# Patient Record
Sex: Female | Born: 1980
Health system: Southern US, Community
[De-identification: ages and names within clinical notes are randomized; demographics above are authoritative.]

## PROBLEM LIST (undated history)

## (undated) DIAGNOSIS — E785 Hyperlipidemia, unspecified: Secondary | ICD-10-CM

## (undated) DIAGNOSIS — F32A Depression, unspecified: Secondary | ICD-10-CM

## (undated) DIAGNOSIS — I10 Essential (primary) hypertension: Secondary | ICD-10-CM

## (undated) DIAGNOSIS — F329 Major depressive disorder, single episode, unspecified: Secondary | ICD-10-CM

## (undated) DIAGNOSIS — F419 Anxiety disorder, unspecified: Secondary | ICD-10-CM

## (undated) DIAGNOSIS — R569 Unspecified convulsions: Secondary | ICD-10-CM

## (undated) HISTORY — DX: Depression, unspecified: F32.A

## (undated) HISTORY — DX: Major depressive disorder, single episode, unspecified: F32.9

## (undated) HISTORY — DX: Anxiety disorder, unspecified: F41.9

## (undated) HISTORY — DX: Hyperlipidemia, unspecified: E78.5

## (undated) HISTORY — DX: Essential (primary) hypertension: I10

---

## 1998-12-30 ENCOUNTER — Other Ambulatory Visit: Admission: RE | Admit: 1998-12-30 | Discharge: 1998-12-30 | Payer: Self-pay | Admitting: Obstetrics & Gynecology

## 2004-01-10 HISTORY — PX: BRAIN SURGERY: SHX531

## 2006-05-31 ENCOUNTER — Emergency Department (HOSPITAL_COMMUNITY): Admission: EM | Admit: 2006-05-31 | Discharge: 2006-05-31 | Payer: Self-pay | Admitting: Emergency Medicine

## 2015-11-22 LAB — OB RESULTS CONSOLE ANTIBODY SCREEN: ANTIBODY SCREEN: NEGATIVE

## 2015-11-22 LAB — OB RESULTS CONSOLE ABO/RH: RH Type: POSITIVE

## 2015-11-22 LAB — OB RESULTS CONSOLE RPR: RPR: NONREACTIVE

## 2015-11-22 LAB — OB RESULTS CONSOLE RUBELLA ANTIBODY, IGM: RUBELLA: IMMUNE

## 2015-11-22 LAB — OB RESULTS CONSOLE HIV ANTIBODY (ROUTINE TESTING): HIV: NONREACTIVE

## 2015-11-22 LAB — OB RESULTS CONSOLE HEPATITIS B SURFACE ANTIGEN: HEP B S AG: NEGATIVE

## 2015-12-17 DIAGNOSIS — Z3480 Encounter for supervision of other normal pregnancy, unspecified trimester: Secondary | ICD-10-CM | POA: Diagnosis not present

## 2015-12-20 DIAGNOSIS — F419 Anxiety disorder, unspecified: Secondary | ICD-10-CM | POA: Diagnosis not present

## 2015-12-24 DIAGNOSIS — O09521 Supervision of elderly multigravida, first trimester: Secondary | ICD-10-CM | POA: Diagnosis not present

## 2015-12-26 LAB — OB RESULTS CONSOLE GC/CHLAMYDIA
Chlamydia: NEGATIVE
Gonorrhea: NEGATIVE

## 2015-12-30 DIAGNOSIS — F330 Major depressive disorder, recurrent, mild: Secondary | ICD-10-CM | POA: Diagnosis not present

## 2015-12-31 ENCOUNTER — Other Ambulatory Visit: Payer: Self-pay | Admitting: Obstetrics and Gynecology

## 2016-01-06 ENCOUNTER — Other Ambulatory Visit (HOSPITAL_COMMUNITY): Payer: Self-pay | Admitting: Obstetrics and Gynecology

## 2016-01-06 ENCOUNTER — Encounter (HOSPITAL_COMMUNITY): Payer: Self-pay

## 2016-01-06 DIAGNOSIS — O09522 Supervision of elderly multigravida, second trimester: Secondary | ICD-10-CM

## 2016-01-06 DIAGNOSIS — Z3A19 19 weeks gestation of pregnancy: Secondary | ICD-10-CM

## 2016-01-06 DIAGNOSIS — Z3689 Encounter for other specified antenatal screening: Secondary | ICD-10-CM

## 2016-01-06 DIAGNOSIS — G40909 Epilepsy, unspecified, not intractable, without status epilepticus: Secondary | ICD-10-CM

## 2016-01-07 DIAGNOSIS — Z23 Encounter for immunization: Secondary | ICD-10-CM | POA: Diagnosis not present

## 2016-01-10 NOTE — L&D Delivery Note (Signed)
Delivery Note  First Stage: Labor onset: prodromal x few days Augmentation : AROM @ 1548 with progression into active labor Analgesia /Anesthesia intrapartum: water immersion and nitrous gas  Second Stage: Complete dilation at 1904 Onset of pushing at 1915 FHR second stage intermittent 130s  Delivery of a viable female at [id number] by CNM in ROA position no nuchal cord Cord double clamped after cessation of pulsation, cut by FOB Cord blood sample collected   Third Stage: [facility] intact with 3 VC @ 2004 Placenta disposition: home with family Uterine tone firm / bleeding small  Small partial 1st perineal splayed at introitus edge Anesthesia for repair: 1% lidocaine local Repair 4-0 x 1 suture for hemostasis Est. Blood Loss (mL): 150  Complications: none  Mom to postpartum.  Baby to Couplet care / Skin to Skin.  Newborn: Birth Weight: 10.3 pounds Apgar Scores: 9-9 Feeding planned: breast  Marlinda MikeBAILEY, Viney Acocella CNM, MSN, FACNM 07/24/2016, 8:14 PM

## 2016-01-11 ENCOUNTER — Ambulatory Visit (HOSPITAL_COMMUNITY)
Admission: RE | Admit: 2016-01-11 | Discharge: 2016-01-11 | Disposition: A | Discharge status: Home / Self Care | Payer: BLUE CROSS/BLUE SHIELD | Source: Ambulatory Visit | Attending: Obstetrics and Gynecology | Admitting: Obstetrics and Gynecology

## 2016-01-11 ENCOUNTER — Other Ambulatory Visit (HOSPITAL_COMMUNITY): Payer: Self-pay | Admitting: *Deleted

## 2016-01-11 VITALS — BP 119/75 | HR 73 | Wt 168.5 lb

## 2016-01-11 DIAGNOSIS — Z315 Encounter for genetic counseling: Secondary | ICD-10-CM | POA: Insufficient documentation

## 2016-01-11 DIAGNOSIS — Z3A13 13 weeks gestation of pregnancy: Secondary | ICD-10-CM | POA: Diagnosis not present

## 2016-01-11 DIAGNOSIS — G40909 Epilepsy, unspecified, not intractable, without status epilepticus: Secondary | ICD-10-CM

## 2016-01-11 DIAGNOSIS — O99351 Diseases of the nervous system complicating pregnancy, first trimester: Secondary | ICD-10-CM | POA: Diagnosis not present

## 2016-01-11 DIAGNOSIS — O09529 Supervision of elderly multigravida, unspecified trimester: Secondary | ICD-10-CM | POA: Insufficient documentation

## 2016-01-11 DIAGNOSIS — O99352 Diseases of the nervous system complicating pregnancy, second trimester: Principal | ICD-10-CM

## 2016-01-11 DIAGNOSIS — O09521 Supervision of elderly multigravida, first trimester: Secondary | ICD-10-CM | POA: Insufficient documentation

## 2016-01-11 NOTE — Progress Notes (Signed)
Genetic Counseling  High-Risk Gestation Note  Appointment Date:  01/11/2016 Referred By: Gerald Leitz, MD Date of Birth:  [birth year]   Pregnancy History: [id number] Estimated Date of Delivery: 07/17/16 Estimated Gestational Age: [redacted]w[redacted]d Attending: Charlsie Merles, MD    Ms. Stacy Rivera was seen for genetic counseling because of a maternal age of 36 y.o..     In summary:  Discussed increased risk for fetal aneuploidy with advanced maternal age  Reviewed results of patient's NIPS ([place])   Ultrasound scheduled for 02/21/16  Discussed limitations of screening and option of diagnostic testing  Declined amniocentesis  Reviewed family history concerns  Discussed carrier screening options  CF- declined  SMA- elected to pursue today  Hemoglobinopathies-declined  MFM consult today given history of epilepsy and Keppra use currently  She was counseled regarding maternal age and the association with risk for chromosome conditions due to nondisjunction with aging of the ova.   We reviewed chromosomes, nondisjunction, and the associated 1 in 65 risk for fetal aneuploidy related to a maternal age of 36 y.o. at [redacted]w[redacted]d gestation.  She was counseled that the risk for aneuploidy decreases as gestational age increases, accounting for those pregnancies which spontaneously abort.  We specifically discussed Down syndrome (trisomy 30), trisomies 37 and 67, and sex chromosome aneuploidies (47,XXX and 47,XXY) including the common features and prognoses of each.   We also reviewed the results of Ms. Leory Plowman Hemann's non-invasive prenatal screening (NIPS).  We discussed that NIPS analyzes placental cell free DNA in maternal circulation to evaluate for the presence of extra chromosome conditions.  Thus, it is able to provide risk assessment for specific chromosome conditions, but is not diagnostic.  Specifically, Ms. Mullarkey had [place] through [place]. We reviewed that these are within normal limits, showing a less  than 1 in 10,000 risk for trisomies 21, 18 and 13, and monosomy X (Turner syndrome).  In addition, the risk for triploidy and sex chromosome trisomies (47,XXX and 47,XXY) was also low risk.   We reviewed that this testing identifies > 99% of pregnancies with trisomy 77, trisomy 31, sex chromosome trisomies (47,XXX and 47,XXY), and triploidy. The detection rate for trisomy 18 is 98%.  The detection rate for monosomy X is ~92%.  The false positive rate is <0.1% for all conditions. Testing was also consistent with female fetal sex.    She was also counseled that 50-80% of fetuses with Down syndrome and up to 90% of fetuses with trisomies 13 and 18, when well visualized, have detectable anomalies or soft markers by ultrasound.  Detailed ultrasound is scheduled for 02/21/16 in our office.   We also discussed the availability of diagnostic testing by way of amniocentesis.  We reviewed the risks, benefits and limitations of amniocentesis including the approximate 1 in 300-500 risk for pregnancy complications following amniocentesis. We discussed the possible results that the tests might provide including: positive, negative, unanticipated, and no result. Finally, they were counseled regarding the cost of each option and potential out of pocket expenses. After reviewing the above results and the available options, Ms. Jerral Ralph declined amniocentesis.  She understands that ultrasound and NIPS cannot rule out all birth defects or genetic syndromes.    Ms. BRIGGETTE NAJARIAN  was provided with written information regarding cystic fibrosis (CF), spinal muscular atrophy (SMA) and hemoglobinopathies including the carrier frequency, availability of carrier screening and prenatal diagnosis if indicated.  In addition, we discussed that CF and hemoglobinopathies are routinely screened for as part of  the [place] newborn screening panel.  After further discussion, she elected to pursue SMA carrier screening and declined CF and  hemoglobinopathy evaluation at this time.  Both family histories were reviewed and found to be noncontributory for birth defects, intellectual disability, and known genetic conditions. Without further information regarding the provided family history, an accurate genetic risk cannot be calculated. Further genetic counseling is warranted if more information is obtained. Ms. Imogene BurnChen reported [place] ancestry, and her husband reportedly has [place] ancestry.   Ms. Jerral Ralphngrid Y Curto denied exposure to environmental toxins or chemical agents. She denied the use of alcohol, tobacco or street drugs. She denied significant viral illnesses during the course of her pregnancy. Her medical and surgical histories were contributory for epilepsy secondary to a cavernous malformation, status post surgery at age 36 years old. She is currently taking Keppra. See separate MFM consultation note from today's visit for detailed discussion regarding this history and management in the current pregnancy.    I counseled Ms. Jerral RalphIngrid Y Miskell regarding the above risks and available options.  The approximate face-to-face time with the genetic counselor was 40 minutes.  Quinn PlowmanKaren Allin Frix, MS,  Certified Genetic Counselor 01/11/2016

## 2016-01-11 NOTE — Progress Notes (Signed)
Maternal Fetal Medicine Consultation  Requesting Provider(s): [name]  Primary Ob: [name] Reason for consultation: Epilepsy. AMA  HPI: 35yo P1011 at 13+1 weeks referred for her hisory of epilepsy. She has advanced maternal age, and saw genetic counseling already today. He has negative NIPT, but did elect to undergo SMA testing, which has been done this morning. She was diagnosed with epilepsy after a grand mal seizure at age 36. She had partial complex seizures controlled with meds. 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 She has been on "maintainence" Keppra since that time. She has had 2 seizures since her surgery, one in 2008, and the last in 2011. Her neurologist felt these were "outliers" and were not indicative of treatment failure. [place] and has not established with a neurologist as of yet. She wants to get re-established with [facility] neurolog[facility] and is calling them soon. She has not had a recent levetiracetam level.   OB History: OB History    Gravida Para Term Preterm AB Living   3 1 1   1 1    SAB TAB Ectopic Multiple Live Births   1            The patient had a poor delivery experience and suffered from some postpartum depression  PMH: No past medical history on file.  PSH: No past surgical history on file. Meds: PNV, levetiracetam 1125mg  daily Allergies: None Fh: Noncontributory Soc: Denies alcohol, tobacco and illicit drug use Review of Systems: no vaginal bleeding or cramping/contractions, no LOF, no nausea/vomiting. All other systems reviewed and are negative.   PE: See EPIC section    A/P:  1. AMA: see genetic counseling report  2. Epilepsy and medication exposure: I reassured the patient that she was on the "drug of choice" for epilepsy in pregnancy and that her risk for anomalies was only minimally elevated above the baseline. I have scheduled her here for detailed  anatomic survey at 19 weeks, and for fetal echocardiography at 22-24 weeks. She might benefit from growth scans periodically during the remainder of her pregnancy, and these can be done here or in your office at your discretion. She will be coming in to your office next week and I would strongly recommend getting a levetiracetam level at that visit. It is unlikely she will be able to get a neurology appointment before then. Levetiracetam undergoes a much higher level of clearance in pregnancy than many other antiepileptics, with levels reaching 40% of baseline by the 3rd trimester. She would benefit from levels every 4-6 weeks.  3. History of postpartum depression: recommend evaluation with a screening tool such as Edinburgh scale during mid-to-late 3rd trimester  Thank you for the opportunity to be a part of the care of Stacy Rivera. Please contact our office if we can be of further assistance.   I spent approximately 30 minutes with this patient with over 50% of time spent in face-to-face counseling.

## 2016-01-13 DIAGNOSIS — Z8669 Personal history of other diseases of the nervous system and sense organs: Secondary | ICD-10-CM | POA: Diagnosis not present

## 2016-01-13 DIAGNOSIS — Z3A13 13 weeks gestation of pregnancy: Secondary | ICD-10-CM | POA: Diagnosis not present

## 2016-01-13 DIAGNOSIS — F330 Major depressive disorder, recurrent, mild: Secondary | ICD-10-CM | POA: Diagnosis not present

## 2016-01-17 ENCOUNTER — Other Ambulatory Visit: Payer: Self-pay

## 2016-01-17 DIAGNOSIS — Z1371 Encounter for nonprocreative screening for genetic disease carrier status: Secondary | ICD-10-CM | POA: Diagnosis not present

## 2016-01-17 DIAGNOSIS — Z31430 Encounter of female for testing for genetic disease carrier status for procreative management: Secondary | ICD-10-CM | POA: Diagnosis not present

## 2016-01-19 ENCOUNTER — Telehealth (HOSPITAL_COMMUNITY): Payer: Self-pay | Admitting: Genetics

## 2016-01-19 NOTE — Telephone Encounter (Signed)
Called Stacy Rivera regarding her Counsyl carrier screening results.  Stacy Rivera underwent screening for Spinal Muscular Atrophy (SMA) only.  This screen identified that she has two copies of the SMN1 gene, which is a normal result.  We discussed that we cannot prove that those copies are on different chromosomes, which is why there is a residual 1 in 700 chance for her to be a carrier for SMA.  She understood the information discussed and had no additional questions.  She has the contact information for our office and will call if additional questions or concerns arise. Mady Gemmaaragh Auren Valdes, MS Certified Genetic Counselor

## 2016-01-20 DIAGNOSIS — Z3480 Encounter for supervision of other normal pregnancy, unspecified trimester: Secondary | ICD-10-CM | POA: Diagnosis not present

## 2016-01-26 DIAGNOSIS — J019 Acute sinusitis, unspecified: Secondary | ICD-10-CM | POA: Diagnosis not present

## 2016-02-03 DIAGNOSIS — F330 Major depressive disorder, recurrent, mild: Secondary | ICD-10-CM | POA: Diagnosis not present

## 2016-02-17 DIAGNOSIS — F330 Major depressive disorder, recurrent, mild: Secondary | ICD-10-CM | POA: Diagnosis not present

## 2016-02-21 ENCOUNTER — Ambulatory Visit (HOSPITAL_COMMUNITY): Payer: Self-pay

## 2016-02-21 ENCOUNTER — Encounter (HOSPITAL_COMMUNITY): Payer: Self-pay

## 2016-02-22 ENCOUNTER — Ambulatory Visit (HOSPITAL_COMMUNITY): Payer: Self-pay

## 2016-02-22 ENCOUNTER — Encounter (HOSPITAL_COMMUNITY): Payer: Self-pay

## 2016-02-22 ENCOUNTER — Ambulatory Visit (HOSPITAL_COMMUNITY)
Admission: RE | Admit: 2016-02-22 | Discharge: 2016-02-22 | Disposition: A | Discharge status: Home / Self Care | Payer: BLUE CROSS/BLUE SHIELD | Source: Ambulatory Visit | Attending: Obstetrics and Gynecology | Admitting: Obstetrics and Gynecology

## 2016-02-22 DIAGNOSIS — G40909 Epilepsy, unspecified, not intractable, without status epilepticus: Secondary | ICD-10-CM | POA: Diagnosis not present

## 2016-02-22 DIAGNOSIS — O99352 Diseases of the nervous system complicating pregnancy, second trimester: Secondary | ICD-10-CM | POA: Insufficient documentation

## 2016-02-22 DIAGNOSIS — Z3689 Encounter for other specified antenatal screening: Secondary | ICD-10-CM

## 2016-02-22 DIAGNOSIS — Z3A19 19 weeks gestation of pregnancy: Secondary | ICD-10-CM | POA: Insufficient documentation

## 2016-02-22 DIAGNOSIS — O09522 Supervision of elderly multigravida, second trimester: Secondary | ICD-10-CM | POA: Insufficient documentation

## 2016-02-22 DIAGNOSIS — Z363 Encounter for antenatal screening for malformations: Secondary | ICD-10-CM | POA: Insufficient documentation

## 2016-02-22 DIAGNOSIS — O99322 Drug use complicating pregnancy, second trimester: Secondary | ICD-10-CM | POA: Diagnosis not present

## 2016-02-22 DIAGNOSIS — O09529 Supervision of elderly multigravida, unspecified trimester: Secondary | ICD-10-CM

## 2016-02-27 DIAGNOSIS — O09523 Supervision of elderly multigravida, third trimester: Secondary | ICD-10-CM | POA: Diagnosis not present

## 2016-02-27 DIAGNOSIS — Z3480 Encounter for supervision of other normal pregnancy, unspecified trimester: Secondary | ICD-10-CM | POA: Diagnosis not present

## 2016-02-27 DIAGNOSIS — Z3A33 33 weeks gestation of pregnancy: Secondary | ICD-10-CM | POA: Diagnosis not present

## 2016-02-27 DIAGNOSIS — O99353 Diseases of the nervous system complicating pregnancy, third trimester: Secondary | ICD-10-CM | POA: Diagnosis not present

## 2016-03-02 DIAGNOSIS — F330 Major depressive disorder, recurrent, mild: Secondary | ICD-10-CM | POA: Diagnosis not present

## 2016-03-16 DIAGNOSIS — F330 Major depressive disorder, recurrent, mild: Secondary | ICD-10-CM | POA: Diagnosis not present

## 2016-03-17 DIAGNOSIS — Z3A22 22 weeks gestation of pregnancy: Secondary | ICD-10-CM | POA: Diagnosis not present

## 2016-03-17 DIAGNOSIS — G40909 Epilepsy, unspecified, not intractable, without status epilepticus: Secondary | ICD-10-CM | POA: Diagnosis not present

## 2016-03-17 DIAGNOSIS — O99350 Diseases of the nervous system complicating pregnancy, unspecified trimester: Secondary | ICD-10-CM | POA: Diagnosis not present

## 2016-04-12 DIAGNOSIS — Z3A26 26 weeks gestation of pregnancy: Secondary | ICD-10-CM | POA: Diagnosis not present

## 2016-04-12 DIAGNOSIS — O09522 Supervision of elderly multigravida, second trimester: Secondary | ICD-10-CM | POA: Diagnosis not present

## 2016-04-13 DIAGNOSIS — F330 Major depressive disorder, recurrent, mild: Secondary | ICD-10-CM | POA: Diagnosis not present

## 2016-04-27 DIAGNOSIS — F330 Major depressive disorder, recurrent, mild: Secondary | ICD-10-CM | POA: Diagnosis not present

## 2016-05-04 DIAGNOSIS — O09523 Supervision of elderly multigravida, third trimester: Secondary | ICD-10-CM | POA: Diagnosis not present

## 2016-05-04 DIAGNOSIS — Z3689 Encounter for other specified antenatal screening: Secondary | ICD-10-CM | POA: Diagnosis not present

## 2016-05-04 DIAGNOSIS — Z23 Encounter for immunization: Secondary | ICD-10-CM | POA: Diagnosis not present

## 2016-05-04 DIAGNOSIS — Z3A29 29 weeks gestation of pregnancy: Secondary | ICD-10-CM | POA: Diagnosis not present

## 2016-05-16 DIAGNOSIS — Z3A13 13 weeks gestation of pregnancy: Secondary | ICD-10-CM | POA: Diagnosis not present

## 2016-05-16 DIAGNOSIS — G2581 Restless legs syndrome: Secondary | ICD-10-CM | POA: Diagnosis not present

## 2016-05-16 DIAGNOSIS — R569 Unspecified convulsions: Secondary | ICD-10-CM | POA: Diagnosis not present

## 2016-05-16 DIAGNOSIS — O26891 Other specified pregnancy related conditions, first trimester: Secondary | ICD-10-CM | POA: Diagnosis not present

## 2016-05-16 DIAGNOSIS — G40109 Localization-related (focal) (partial) symptomatic epilepsy and epileptic syndromes with simple partial seizures, not intractable, without status epilepticus: Secondary | ICD-10-CM | POA: Diagnosis not present

## 2016-05-16 DIAGNOSIS — Z9889 Other specified postprocedural states: Secondary | ICD-10-CM | POA: Diagnosis not present

## 2016-05-16 DIAGNOSIS — Z8669 Personal history of other diseases of the nervous system and sense organs: Secondary | ICD-10-CM | POA: Diagnosis not present

## 2016-05-18 DIAGNOSIS — Z3A31 31 weeks gestation of pregnancy: Secondary | ICD-10-CM | POA: Diagnosis not present

## 2016-05-18 DIAGNOSIS — O99810 Abnormal glucose complicating pregnancy: Secondary | ICD-10-CM | POA: Diagnosis not present

## 2016-05-25 DIAGNOSIS — F330 Major depressive disorder, recurrent, mild: Secondary | ICD-10-CM | POA: Diagnosis not present

## 2016-05-29 DIAGNOSIS — Z3A33 33 weeks gestation of pregnancy: Secondary | ICD-10-CM | POA: Diagnosis not present

## 2016-05-29 DIAGNOSIS — O09523 Supervision of elderly multigravida, third trimester: Secondary | ICD-10-CM | POA: Diagnosis not present

## 2016-05-29 DIAGNOSIS — O99353 Diseases of the nervous system complicating pregnancy, third trimester: Secondary | ICD-10-CM | POA: Diagnosis not present

## 2016-06-14 DIAGNOSIS — Z3685 Encounter for antenatal screening for Streptococcus B: Secondary | ICD-10-CM | POA: Diagnosis not present

## 2016-06-14 DIAGNOSIS — Z3483 Encounter for supervision of other normal pregnancy, third trimester: Secondary | ICD-10-CM | POA: Diagnosis not present

## 2016-06-14 LAB — OB RESULTS CONSOLE GBS: STREP GROUP B AG: NEGATIVE

## 2016-06-15 DIAGNOSIS — F330 Major depressive disorder, recurrent, mild: Secondary | ICD-10-CM | POA: Diagnosis not present

## 2016-06-26 DIAGNOSIS — Z3A37 37 weeks gestation of pregnancy: Secondary | ICD-10-CM | POA: Diagnosis not present

## 2016-06-26 DIAGNOSIS — O99353 Diseases of the nervous system complicating pregnancy, third trimester: Secondary | ICD-10-CM | POA: Diagnosis not present

## 2016-06-26 DIAGNOSIS — O09523 Supervision of elderly multigravida, third trimester: Secondary | ICD-10-CM | POA: Diagnosis not present

## 2016-06-29 DIAGNOSIS — F330 Major depressive disorder, recurrent, mild: Secondary | ICD-10-CM | POA: Diagnosis not present

## 2016-07-03 DIAGNOSIS — Z3A38 38 weeks gestation of pregnancy: Secondary | ICD-10-CM | POA: Diagnosis not present

## 2016-07-03 DIAGNOSIS — O09523 Supervision of elderly multigravida, third trimester: Secondary | ICD-10-CM | POA: Diagnosis not present

## 2016-07-06 DIAGNOSIS — F330 Major depressive disorder, recurrent, mild: Secondary | ICD-10-CM | POA: Diagnosis not present

## 2016-07-12 ENCOUNTER — Encounter (HOSPITAL_COMMUNITY): Payer: Self-pay | Admitting: *Deleted

## 2016-07-12 ENCOUNTER — Inpatient Hospital Stay (HOSPITAL_COMMUNITY)
Admission: AD | Admit: 2016-07-12 | Discharge: 2016-07-12 | Disposition: A | Discharge status: Home / Self Care | Payer: BLUE CROSS/BLUE SHIELD | Source: Ambulatory Visit | Attending: Obstetrics & Gynecology | Admitting: Obstetrics & Gynecology

## 2016-07-12 DIAGNOSIS — O471 False labor at or after 37 completed weeks of gestation: Secondary | ICD-10-CM | POA: Insufficient documentation

## 2016-07-12 DIAGNOSIS — O09529 Supervision of elderly multigravida, unspecified trimester: Secondary | ICD-10-CM

## 2016-07-12 DIAGNOSIS — Z3A39 39 weeks gestation of pregnancy: Secondary | ICD-10-CM | POA: Diagnosis not present

## 2016-07-12 HISTORY — DX: Unspecified convulsions: R56.9

## 2016-07-12 NOTE — MAU Note (Signed)
Pt reports contractions since Monday which are getting stronger this morning.

## 2016-07-12 NOTE — Discharge Instructions (Signed)

## 2016-07-12 NOTE — MAU Note (Signed)
I have communicated with T Baily CNM and reviewed vital signs:  Vitals:   07/12/16 0336 07/12/16 0421  BP: 114/81 116/84  Pulse: 79 84  Resp: 18 18  Temp: 98 F (36.7 C)     Vaginal exam:  Dilation: 4 Effacement (%): 60 Cervical Position: Middle Station: -2 Presentation: Vertex Exam by:: B Korry Dalgleish RN,   T Baily CNM also reviewed contraction pattern and that non-stress test is reactive.  It has been documented that patient is contracting every 4-9 minutes with minimal cervical change since her exam in the office not indicating active labor.  Patient denies any other complaints.  Based on this report provider has given order for discharge.  A discharge order and diagnosis entered by a provider.   Labor discharge instructions reviewed with patient.

## 2016-07-14 DIAGNOSIS — Z3A39 39 weeks gestation of pregnancy: Secondary | ICD-10-CM | POA: Diagnosis not present

## 2016-07-14 DIAGNOSIS — O09523 Supervision of elderly multigravida, third trimester: Secondary | ICD-10-CM | POA: Diagnosis not present

## 2016-07-20 DIAGNOSIS — O09523 Supervision of elderly multigravida, third trimester: Secondary | ICD-10-CM | POA: Diagnosis not present

## 2016-07-20 DIAGNOSIS — Z3A40 40 weeks gestation of pregnancy: Secondary | ICD-10-CM | POA: Diagnosis not present

## 2016-07-24 ENCOUNTER — Inpatient Hospital Stay (HOSPITAL_COMMUNITY)
Admission: AD | Admit: 2016-07-24 | Discharge: 2016-07-26 | DRG: 775 | Disposition: A | Discharge status: Home / Self Care | Payer: BLUE CROSS/BLUE SHIELD | Source: Ambulatory Visit | Attending: Obstetrics | Admitting: Obstetrics

## 2016-07-24 ENCOUNTER — Encounter (HOSPITAL_COMMUNITY): Payer: Self-pay | Admitting: *Deleted

## 2016-07-24 DIAGNOSIS — Z3A41 41 weeks gestation of pregnancy: Secondary | ICD-10-CM

## 2016-07-24 DIAGNOSIS — G40909 Epilepsy, unspecified, not intractable, without status epilepticus: Secondary | ICD-10-CM | POA: Diagnosis present

## 2016-07-24 DIAGNOSIS — O3663X0 Maternal care for excessive fetal growth, third trimester, not applicable or unspecified: Secondary | ICD-10-CM | POA: Diagnosis present

## 2016-07-24 DIAGNOSIS — O99354 Diseases of the nervous system complicating childbirth: Secondary | ICD-10-CM | POA: Diagnosis present

## 2016-07-24 DIAGNOSIS — O2243 Hemorrhoids in pregnancy, third trimester: Secondary | ICD-10-CM | POA: Diagnosis present

## 2016-07-24 DIAGNOSIS — O480 Post-term pregnancy: Principal | ICD-10-CM | POA: Diagnosis present

## 2016-07-24 DIAGNOSIS — Z2882 Immunization not carried out because of caregiver refusal: Secondary | ICD-10-CM | POA: Diagnosis not present

## 2016-07-24 LAB — CBC
HCT: 42.9 % (ref 36.0–46.0)
Hemoglobin: 14.8 g/dL (ref 12.0–15.0)
MCH: 29.6 pg (ref 26.0–34.0)
MCHC: 34.5 g/dL (ref 30.0–36.0)
MCV: 85.8 fL (ref 78.0–100.0)
Platelets: 259 10*3/uL (ref 150–400)
RBC: 5 MIL/uL (ref 3.87–5.11)
RDW: 14.9 % (ref 11.5–15.5)
WBC: 11.9 10*3/uL — ABNORMAL HIGH (ref 4.0–10.5)

## 2016-07-24 LAB — ABO/RH: ABO/RH(D): B POS

## 2016-07-24 LAB — TYPE AND SCREEN
ABO/RH(D): B POS
Antibody Screen: NEGATIVE

## 2016-07-24 MED ORDER — LEVETIRACETAM 250 MG PO TABS
1125.0000 mg | ORAL_TABLET | Freq: Two times a day (BID) | ORAL | Status: DC
  Administered 2016-07-24 – 2016-07-26 (×4): 1125 mg via ORAL
  Filled 2016-07-24 (×4): qty 1.5

## 2016-07-24 MED ORDER — OXYTOCIN 10 UNIT/ML IJ SOLN
10.0000 [IU] | Freq: Once | INTRAMUSCULAR | Status: DC
  Filled 2016-07-24: qty 1

## 2016-07-24 MED ORDER — WITCH HAZEL-GLYCERIN EX PADS: 1.0000 "application " | MEDICATED_PAD | CUTANEOUS | Status: DC | PRN

## 2016-07-24 MED ORDER — BENZOCAINE-MENTHOL 20-0.5 % EX AERO
1.0000 "application " | INHALATION_SPRAY | CUTANEOUS | Status: DC | PRN
  Administered 2016-07-24: 1 via TOPICAL
  Filled 2016-07-24: qty 56

## 2016-07-24 MED ORDER — LIDOCAINE HCL (PF) 1 % IJ SOLN
30.0000 mL | INTRAMUSCULAR | Status: DC | PRN
  Administered 2016-07-24: 30 mL via SUBCUTANEOUS
  Filled 2016-07-24: qty 30

## 2016-07-24 MED ORDER — DIBUCAINE 1 % RE OINT: 1.0000 "application " | TOPICAL_OINTMENT | RECTAL | Status: DC | PRN

## 2016-07-24 MED ORDER — ACETAMINOPHEN 325 MG PO TABS
650.0000 mg | ORAL_TABLET | ORAL | Status: DC | PRN
  Administered 2016-07-24 – 2016-07-25 (×2): 650 mg via ORAL
  Filled 2016-07-24 (×2): qty 2

## 2016-07-24 MED ORDER — OXYCODONE-ACETAMINOPHEN 5-325 MG PO TABS: 1.0000 | ORAL_TABLET | ORAL | Status: DC | PRN

## 2016-07-24 MED ORDER — COCONUT OIL OIL
1.0000 "application " | TOPICAL_OIL | Status: DC | PRN
  Filled 2016-07-24: qty 120

## 2016-07-24 MED ORDER — OXYCODONE-ACETAMINOPHEN 5-325 MG PO TABS
2.0000 | ORAL_TABLET | ORAL | Status: DC | PRN
Start: 2016-07-24 — End: 2016-07-24

## 2016-07-24 MED ORDER — ACETAMINOPHEN 325 MG PO TABS: 650.0000 mg | ORAL_TABLET | ORAL | Status: DC | PRN

## 2016-07-24 MED ORDER — SIMETHICONE 80 MG PO CHEW
80.0000 mg | CHEWABLE_TABLET | ORAL | Status: DC | PRN
Start: 2016-07-24 — End: 2016-07-26

## 2016-07-24 MED ORDER — SOD CITRATE-CITRIC ACID 500-334 MG/5ML PO SOLN: 30.0000 mL | ORAL | Status: DC | PRN

## 2016-07-24 MED ORDER — LACTATED RINGERS IV SOLN: 500.0000 mL | INTRAVENOUS | Status: DC | PRN

## 2016-07-24 MED ORDER — IBUPROFEN 600 MG PO TABS
600.0000 mg | ORAL_TABLET | Freq: Four times a day (QID) | ORAL | Status: DC
  Administered 2016-07-24 – 2016-07-26 (×7): 600 mg via ORAL
  Filled 2016-07-24 (×7): qty 1

## 2016-07-24 MED ORDER — MISOPROSTOL 200 MCG PO TABS
800.0000 ug | ORAL_TABLET | Freq: Once | ORAL | Status: DC
  Filled 2016-07-24: qty 4

## 2016-07-24 NOTE — Progress Notes (Signed)
S: Ctx are now painful  O:  VS: Blood pressure 122/86, pulse (!) 101, temperature 98.2 F (36.8 C), temperature source Oral, height 5\' 6"  (1.676 m), weight 82.1 kg (181 lb), last menstrual period 10/11/2015.        FHR : baseline 135 / variability moderate / accelerations + / no decelerations        Toco: contractions every 3-4 minutes / moderate          Cervix : deferred        Membranes: clear fluid        Water temp 99.9  A: active labor     FHR category 1  P: OFF continuous EFM       water immersion   Stacy Rivera, Stacy Rivera CNM, MSN, FACNM 07/24/2016, 4:49 PM

## 2016-07-24 NOTE — H&P (Signed)
  OB ADMISSION/ HISTORY & PHYSICAL:  Admission Date: 07/24/2016  2:56 PM  Admit Diagnosis: 41 weeks  Stacy Rivera is a 36 y.o. female presenting for prodromal ctx with advanced dilation and LGA.  Prenatal History: G3P1011   EDC : 07/17/2016, by Last Menstrual Period  [facility]  Primary Ob Provider: Kathi LudwigBailey CNM Prenatal course complicated by AMA (35), use of anticonvulsive therapy for hx seizures (last seizure in 2011 s/p brain surgery in 2006), post-dates at 41 weeks  AT for post-dates performed today: NST-reactive / EFW 9-14 at 95% / AFI 20 / BPP 8-8  Prenatal Labs: ABO, Rh:  Positive Antibody:  negative Rubella:   Immune RPR:   NR HBsAg:   Negative HIV:   NR GTT: abnormal 1hr GTT / 3hr GTT [phone] (1 high) GBS:   negative  Medical / Surgical History :  Past medical history:  Past Medical History:  Diagnosis Date  . Seizures (HCC)    2011    Past surgical history:  Past Surgical History:  Procedure Laterality Date  . BRAIN SURGERY  2006   Family History: No family history on file.   Social History:  reports that she has never smoked. She has never used smokeless tobacco. She reports that she does not drink alcohol or use drugs.  Allergies: Dairy aid [lactase]   Current Medications at time of admission:  Prior to Admission medications   Medication Sig Start Date End Date Taking? Authorizing Provider  Docosahexaenoic Acid (DHA COMPLETE PO) Take by mouth.   Yes [provider]  LevETIRAcetam (KEPPRA PO) Take 1,125 mg by mouth 2 (two) times daily.   Yes [provider]  Prenatal Vit w/Fe-Methylfol-FA (PNV PO) Take by mouth.   Yes [provider]   Review of Systems: Active FM onset of ctx Q 2-4 minutes bloody show absent  Physical Exam:  VS: Last menstrual period 10/11/2015.  General: alert and oriented, appears calm and comfortable Heart: RRR Lungs: Clear lung fields Abdomen: Gravid, soft  and non-tender, non-distended / uterus: gravid Extremities: no edema  Genitalia / VE: Dilation: 6 Effacement (%): 90 Station: -1 Exam by:: Stacy Mikeanya Dason Mosley, CNM  FHR: category 1 TOCO: ctx every 4-6 minutes  Assessment: [redacted] weeks gestation prodromal stage of labor FHR category 1   Plan:  AROM augmentation Water immersion in labor - CNM at bedside/poolside Discuss delivery plan - water birth versus exit tub for birth - recommend bed birth  Dr Ernestina PennaFogleman notified of admission / plan of care   Stacy Rivera, Stacy Rivera CNM, MSN, [facility] 07/24/2016, 3:55 PM

## 2016-07-24 NOTE — Progress Notes (Signed)
S:  painful ctx with rectal pressure       using nitrous for pain control  O:  VS: Blood pressure (!) 136/46, pulse 92, temperature 98.1 F (36.7 C), temperature source Axillary, resp. rate 18, height 5\' 6"  (1.676 m), weight 82.1 kg (181 lb), last menstrual period 10/11/2015.        FHR : 135        Toco: contractions every 2-4 minutes        Cervix : 10cm / 100% vtx +2        Membranes: clear with show        out of tub 1800 -voided / requested additional pain control - nitrous offered and accepted       changing positions with every few contractions - continuous labor support  A: active labor      second stage  P: expectant management      MD stand-by at birth requested   Marlinda MikeBAILEY, Carmita Boom CNM, MSN, [facility] 07/24/2016, 8:20 PM

## 2016-07-25 LAB — CBC
HCT: 34.6 % — ABNORMAL LOW (ref 36.0–46.0)
Hemoglobin: 12.3 g/dL (ref 12.0–15.0)
MCH: 29.8 pg (ref 26.0–34.0)
MCHC: 35.5 g/dL (ref 30.0–36.0)
MCV: 83.8 fL (ref 78.0–100.0)
Platelets: 218 10*3/uL (ref 150–400)
RBC: 4.13 MIL/uL (ref 3.87–5.11)
RDW: 14.8 % (ref 11.5–15.5)
WBC: 16.3 10*3/uL — ABNORMAL HIGH (ref 4.0–10.5)

## 2016-07-25 LAB — RPR: RPR Ser Ql: NONREACTIVE

## 2016-07-25 NOTE — Lactation Note (Signed)
This note was copied from a baby's chart. Lactation Consultation Note  P2, Baby 14 hours old.  Mother breastfed first child for 2 years. Older child had frenotomy by Dr. Jerolyn ShinGhaheri for short posterior lingual frenulum at approx 1 month due to very sore nipples. This baby has short anterior lingual frenulum. Provided family with resources and suggest they discuss with Pediatrician. Mother hand expressed a few drop and latched baby in cradle hold. Offered to assist mother w/ pillows but mother preferred to not use pillows. Sucks and swallows observed intermittently. Encouraged mother to compress breast during feeding to keep baby active. Mom encouraged to feed baby 8-12 times/24 hours and with feeding cues.  Mom made aware of O/P services, breastfeeding support groups, community resources, and our phone # for post-discharge questions.     Patient Name: Stacy Rivera'UToday's Date: 07/25/2016 Reason for consult: Initial assessment   Maternal Data Has patient been taught Hand Expression?: Yes Does the patient have breastfeeding experience prior to this delivery?: Yes  Feeding Feeding Type: Breast Fed Length of feed: 15 min  LATCH Score/Interventions Latch: Grasps breast easily, tongue down, lips flanged, rhythmical sucking.  Audible Swallowing: A few with stimulation  Type of Nipple: Everted at rest and after stimulation  Comfort (Breast/Nipple): Soft / non-tender     Hold (Positioning): Assistance needed to correctly position infant at breast and maintain latch.  LATCH Score: 8  Lactation Tools Discussed/Used     Consult Status Consult Status: Follow-up Date: 07/26/16 Follow-up type: In-patient    Dahlia ByesBerkelhammer, Denitra Donaghey [facility] 07/25/2016, 10:20 AM

## 2016-07-25 NOTE — Progress Notes (Signed)
CSW received consult for Anxiety.  CSW completed chart review and notes limited documentation about this dx (only noted in first PNV and not in H&P, RN Admission Summary, or PNR Problem List).  CSW spoke with CNM regarding current concerns.  CNM not aware of current concerns and states Anxiety was documented related to her first delivery.  CNM offered CSW support to [facility] and she declined, stating she feels well emotionally at this time.  CSW is available if current concerns are noted or by MOB's request.

## 2016-07-25 NOTE — Progress Notes (Signed)
PPD # 1, SVD, 1st degree laceration, baby boy "Milo"  S:  Reports feeling good with minimal discomfort - feels like recovery is much better this time, and is pleased with delivery             Tolerating po/ No nausea or vomiting / Denies dizziness or SOB             Bleeding is light             Pain controlled with Motrin and Tylenol             Up ad lib / ambulatory / voiding QS  Newborn breast feeding - going well, latching well / Circumcision - not planning   O:               VS: BP 112/79 (BP Location: Left Arm)   Pulse 97   Temp 98.2 F (36.8 C) (Oral)   Resp 18   Ht 5\' 6"  (1.676 m)   Wt 82.1 kg (181 lb)   LMP 10/11/2015   SpO2 100%   Breastfeeding? Unknown   BMI 29.21 kg/m    LABS:              Recent Labs  07/24/16 1805 07/25/16 0520  WBC 11.9* 16.3*  HGB 14.8 12.3  PLT 259 218               Blood type: --/--/B POS (07/16 1806)  Rubella: Immune (11/13 0000)                     I&O: Intake/Output      07/16 0701 - 07/17 0700 07/17 0701 - 07/18 0700   Blood 150    Total Output 150     Net -150          Urine Occurrence 1 x    Stool Occurrence 1 x                  Physical Exam:             Alert and oriented X3  Lungs: Clear and unlabored  Heart: regular rate and rhythm / no mumurs  Abdomen: soft, non-tender, non-distended              Fundus: firm, non-tender, U-1  Perineum: well approximated 1st degree - healing well, no significant edema or erythema  Lochia: appropriate, no clots  Extremities: no edema, no calf pain or tenderness    A: PPD # 1, SVD  1st degree laceration   Doing well - stable status  P: Routine post partum orders  See lactation today  May shower today  Anticipate discharge home tomorrow   Carlean JewsMeredith Alianny Toelle, MSN, CNM [facility]

## 2016-07-26 MED ORDER — IBUPROFEN 600 MG PO TABS: 600.0000 mg | ORAL_TABLET | Freq: Four times a day (QID) | ORAL | 0 refills | Status: DC

## 2016-07-26 MED ORDER — SENNOSIDES-DOCUSATE SODIUM 8.6-50 MG PO TABS
2.0000 | ORAL_TABLET | ORAL | Status: DC
  Administered 2016-07-26: 2 via ORAL
  Filled 2016-07-26: qty 2

## 2016-07-26 NOTE — Discharge Summary (Signed)
Obstetric Discharge Summary   Patient Name: Stacy Rivera DOB: 1980-03-09 MRN: [id number]  Date of Admission: 07/24/2016 Date of Discharge: 07/26/2016 Date of Delivery: 07/24/16 Gestational Age at Delivery: [redacted]w[redacted]d  Primary OB: Stacy Rivera   Antepartum complications:  - AMA (35) - Use of anticonvulsive therapy for hx seizures (last seizure in 2011 s/p brain surgery in 2006) - post-dates at 41 weeks - Hx of Postpartum anxiety  - Hx.of Mastitis and yeast of breast  Prenatal Labs:  ABO, Rh:  B Pos Antibody:  Negative Rubella:   Immune RPR:   NR HBsAg:   Negative HIV:   NR GTT: abnormal 1hr GTT / 3hr GTT [phone] (1 high) GBS:   negative  Admitting Diagnosis: Induction of labor for postdates   Secondary Diagnoses: Patient Active Problem List   Diagnosis Date Noted  . Post-dates pregnancy 07/24/2016  . SVD (spontaneous vaginal delivery) 07/24/2016  . Postpartum care following vaginal delivery (7/16) 07/24/2016  . Advanced maternal age in multigravida 01/11/2016    Augmentation: AROM Complications: none  Date of Delivery: 07/24/16 Delivered By: Stacy Rivera, Rivera  Delivery Type: spontaneous vaginal delivery Anesthesia: Nitrous PRN / hydrotherapy  Placenta: sponatneous Laceration: 1st degree laceration  Episiotomy: none  Newborn Data: Live born female  Birth Weight: 10 lb 3.8 oz (4644 g) APGAR: 9, 9   Postpartum Course  (Vaginal Delivery): Patient had an uncomplicated postpartum course.  By time of discharge on PPD#2, her pain was controlled on oral pain medications; she had appropriate lochia and was ambulating, voiding without difficulty and tolerating regular diet.  She was deemed stable for discharge to home.    Labs: CBC Latest Ref Rng & Units 07/25/2016 07/24/2016  WBC 4.0 - 10.5 K/uL 16.3(H) 11.9(H)  Hemoglobin 12.0 - 15.0 g/dL 82.9 56.2  Hematocrit 13.0 - 46.0 % 34.6(L) 42.9  Platelets 150 - 400 K/uL 218 259   B POS  Physical  exam:  BP 120/86 (BP Location: Left Arm)   Pulse 76   Temp 97.7 F (36.5 C) (Oral)   Resp 18   Ht 5\' 6"  (1.676 m)   Wt 82.1 kg (181 lb)   LMP 10/11/2015   SpO2 100%   Breastfeeding? Unknown   BMI 29.21 kg/m  General: alert and no distress Pulm: normal respiratory effort Lochia: appropriate Abdomen: soft, NT Uterine Fundus: firm, below umbilicus Perineum: healing well, no significant erythema, no significant edema Extremities: No evidence of DVT seen on physical exam. No lower extremity edema.  Disposition: stable, discharge to home Baby Feeding: breast milk Baby Disposition: home with mom  Contraception: Planning Skyla IUD  Rh Immune globulin given: N/A Rubella vaccine given: N/A Tdap vaccine given in AP or PP setting: UTD Flu vaccine given in AP or PP setting: Not given   Plan:  Stacy Rivera was discharged to home in good condition. Follow-up appointment at Stacy Rivera in 2 weeks for repeat EPDS. Desires Skyla IUD at Stacy Rivera visit.   Discharge Instructions: Per After Visit Summary. Activity: Advance as tolerated. Pelvic rest for 6 weeks.  Refer to After Visit Summary Diet: Regular, Heart Healthy Discharge Medications: Allergies as of 07/26/2016      Reactions   Dairy Aid [lactase] Diarrhea      Medication List    TAKE these medications   DHA COMPLETE PO Take 1 capsule by mouth every morning.   ibuprofen 600 MG tablet Commonly known as:  ADVIL,MOTRIN Take 1 tablet (600 mg total) by mouth  every 6 (six) hours.   levETIRAcetam 750 MG tablet Commonly known as:  KEPPRA Take 1,125 mg by mouth 2 (two) times daily. Take 1.5 tablets twice daily.   PNV PO Take 1 capsule by mouth every morning.   ranitidine 75 MG tablet Commonly known as:  ZANTAC Take 75 mg by mouth at bedtime as needed for heartburn.      Outpatient follow up:  Follow-up Information    Stacy Rivera. Schedule an appointment as soon as possible for a visit in 2 week(s).   Specialty:   Obstetrics and Gynecology Why:  Repeat EPDS, then 6 weeks for Postpartum visit, [facility] Contact information: [address] [place] [id number] [phone]          Signed:  Carlean JewsMeredith Dennison Mcdaid, MSN, Rivera [facility]

## 2016-07-26 NOTE — Lactation Note (Signed)
This note was copied from a baby's chart. Lactation Consultation Note  Patient Name: Boy Darcella Gasmanngrid Garciaperez ZOXWR'UToday's Date: 07/26/2016 Reason for consult: Follow-up assessment  Baby 39 hours old. Mom reports that baby cluster-fed through the night and is finally sleeping. Offered to assist with latch but mom states that she knows all the tricks, and now she just needs to keep working with the baby. Mom and Tamela OddiBetsy, RN report that mom was able to get baby to latch more deeply this morning. Mom reports that her first child had a tight frenulum and frenotomy at 6 weeks--after suffering with painful nipples, mastitis and thrush. Enc mom to have baby evaluated as soon as she can in order to avoid the issues she had before. Mom reports that she is already working on having this baby evaluated. Enc mom to [facility] as needed. Mom aware of support group and scales as well. Enc mom to call for assistance as needed and she is aware of LC phone line assistance after D/C. Mom denies any need for further assistance at this time.   Maternal Data    Feeding Feeding Type: Breast Fed Length of feed: 30 min  LATCH Score/Interventions                      Lactation Tools Discussed/Used     Consult Status Consult Status: PRN    Sherlyn HayJennifer D Holger Sokolowski 07/26/2016, 11:51 AM

## 2016-07-26 NOTE — Progress Notes (Signed)
Pt scored 9 on Edinburgh Depression scale. I notified CNM Meridith Sigmon and SW Lulu RidingColleen Shaw. Pt declines seeing social worker today. Ms Imogene BurnChen states she has resources and support. Ms Imogene BurnChen agrees to a 2 week follow up with OB

## 2016-07-26 NOTE — Progress Notes (Addendum)
PPD #2, SVD, 1st degree laceration, baby boy "Milo"  S:  Reports feeling good, tired because baby was cluster feeding all night - express they are ready to go home today   Reports some constipation              Tolerating po/ No nausea or vomiting / Denies dizziness or SOB             Bleeding is light             Pain controlled with Motrin             Up ad lib / ambulatory / voiding QS  Newborn breast feeding  / Circumcision - not planning   O:               VS: BP 120/86 (BP Location: Left Arm)   Pulse 76   Temp 97.7 F (36.5 C) (Oral)   Resp 18   Ht 5\' 6"  (1.676 m)   Wt 82.1 kg (181 lb)   LMP 10/11/2015   SpO2 100%   Breastfeeding? Unknown   BMI 29.21 kg/m     LABS:              Recent Labs  07/24/16 1805 07/25/16 0520  WBC 11.9* 16.3*  HGB 14.8 12.3  PLT 259 218               Blood type: --/--/B POS (07/16 1806)  Rubella: Immune (11/13 0000)                                Physical Exam:             Alert and oriented X3  Lungs: Clear and unlabored  Heart: regular rate and rhythm / no mumurs  Abdomen: soft, non-tender, non-distended              Fundus: firm, non-tender, U-2  Perineum: well approximated 1st degree laceration - healing well, no significant edema or erythema  Lochia: appropriate, no clots  Extremities: no edema, no calf pain or tenderness    A: PPD # 2  1st degree laceration   Constipation  External Hemorrhoid   Hx. Of PP Anxiety - EPDS today score: 9  Hx. Of Mastitis and Yeast of breast  Doing well - stable status  P: Routine post partum orders  Discharge home today  WOB discharge booklet and instructions given   Advised to continue daily Colace until resolution of constipation   Continue using Tucks pads PRN for hemorrhoid - may use Preparation H or OTC Anusol for relief   Warning s/s reviewed  CSW offered prior to discharge based on EPDS  F/U with T. Fredric MareBailey, CNM in 2 weeks for repeat EPDS  Carlean JewsMeredith Sigmon, MSN, CNM [facility]

## 2016-09-05 DIAGNOSIS — R569 Unspecified convulsions: Secondary | ICD-10-CM | POA: Diagnosis not present

## 2016-09-06 DIAGNOSIS — Z30430 Encounter for insertion of intrauterine contraceptive device: Secondary | ICD-10-CM | POA: Diagnosis not present

## 2016-11-09 DIAGNOSIS — F4322 Adjustment disorder with anxiety: Secondary | ICD-10-CM | POA: Diagnosis not present

## 2016-11-16 DIAGNOSIS — F4322 Adjustment disorder with anxiety: Secondary | ICD-10-CM | POA: Diagnosis not present

## 2016-12-07 DIAGNOSIS — F4322 Adjustment disorder with anxiety: Secondary | ICD-10-CM | POA: Diagnosis not present

## 2016-12-21 DIAGNOSIS — F4322 Adjustment disorder with anxiety: Secondary | ICD-10-CM | POA: Diagnosis not present

## 2016-12-28 DIAGNOSIS — F4322 Adjustment disorder with anxiety: Secondary | ICD-10-CM | POA: Diagnosis not present

## 2017-01-18 DIAGNOSIS — F4322 Adjustment disorder with anxiety: Secondary | ICD-10-CM | POA: Diagnosis not present

## 2017-02-08 DIAGNOSIS — F4322 Adjustment disorder with anxiety: Secondary | ICD-10-CM | POA: Diagnosis not present

## 2017-02-20 DIAGNOSIS — F4322 Adjustment disorder with anxiety: Secondary | ICD-10-CM | POA: Diagnosis not present

## 2017-03-08 DIAGNOSIS — F4322 Adjustment disorder with anxiety: Secondary | ICD-10-CM | POA: Diagnosis not present

## 2017-03-16 DIAGNOSIS — F4322 Adjustment disorder with anxiety: Secondary | ICD-10-CM | POA: Diagnosis not present

## 2017-04-26 DIAGNOSIS — F4322 Adjustment disorder with anxiety: Secondary | ICD-10-CM | POA: Diagnosis not present

## 2017-05-10 DIAGNOSIS — J019 Acute sinusitis, unspecified: Secondary | ICD-10-CM | POA: Diagnosis not present

## 2017-05-10 DIAGNOSIS — R05 Cough: Secondary | ICD-10-CM | POA: Diagnosis not present

## 2017-05-17 DIAGNOSIS — F4322 Adjustment disorder with anxiety: Secondary | ICD-10-CM | POA: Diagnosis not present

## 2017-05-24 DIAGNOSIS — F4322 Adjustment disorder with anxiety: Secondary | ICD-10-CM | POA: Diagnosis not present

## 2017-05-31 DIAGNOSIS — F4322 Adjustment disorder with anxiety: Secondary | ICD-10-CM | POA: Diagnosis not present

## 2017-06-15 DIAGNOSIS — F4322 Adjustment disorder with anxiety: Secondary | ICD-10-CM | POA: Diagnosis not present

## 2017-06-22 DIAGNOSIS — F4322 Adjustment disorder with anxiety: Secondary | ICD-10-CM | POA: Diagnosis not present

## 2017-06-25 DIAGNOSIS — R4586 Emotional lability: Secondary | ICD-10-CM | POA: Diagnosis not present

## 2017-06-25 DIAGNOSIS — O99345 Other mental disorders complicating the puerperium: Secondary | ICD-10-CM | POA: Diagnosis not present

## 2017-07-11 DIAGNOSIS — F4322 Adjustment disorder with anxiety: Secondary | ICD-10-CM | POA: Diagnosis not present

## 2017-07-27 DIAGNOSIS — F4322 Adjustment disorder with anxiety: Secondary | ICD-10-CM | POA: Diagnosis not present

## 2017-08-29 DIAGNOSIS — Z9889 Other specified postprocedural states: Secondary | ICD-10-CM | POA: Diagnosis not present

## 2017-08-29 DIAGNOSIS — Z86011 Personal history of benign neoplasm of the brain: Secondary | ICD-10-CM | POA: Diagnosis not present

## 2017-08-29 DIAGNOSIS — Z85841 Personal history of malignant neoplasm of brain: Secondary | ICD-10-CM | POA: Diagnosis not present

## 2017-08-29 DIAGNOSIS — Z79899 Other long term (current) drug therapy: Secondary | ICD-10-CM | POA: Diagnosis not present

## 2017-08-29 DIAGNOSIS — G40009 Localization-related (focal) (partial) idiopathic epilepsy and epileptic syndromes with seizures of localized onset, not intractable, without status epilepticus: Secondary | ICD-10-CM | POA: Diagnosis not present

## 2017-08-29 DIAGNOSIS — G40109 Localization-related (focal) (partial) symptomatic epilepsy and epileptic syndromes with simple partial seizures, not intractable, without status epilepticus: Secondary | ICD-10-CM | POA: Diagnosis not present

## 2017-09-07 DIAGNOSIS — Z01419 Encounter for gynecological examination (general) (routine) without abnormal findings: Secondary | ICD-10-CM | POA: Diagnosis not present

## 2017-09-07 DIAGNOSIS — Z6825 Body mass index (BMI) 25.0-25.9, adult: Secondary | ICD-10-CM | POA: Diagnosis not present

## 2017-09-20 DIAGNOSIS — F4322 Adjustment disorder with anxiety: Secondary | ICD-10-CM | POA: Diagnosis not present

## 2017-10-04 DIAGNOSIS — F4322 Adjustment disorder with anxiety: Secondary | ICD-10-CM | POA: Diagnosis not present

## 2017-10-11 DIAGNOSIS — F4322 Adjustment disorder with anxiety: Secondary | ICD-10-CM | POA: Diagnosis not present

## 2017-10-12 ENCOUNTER — Other Ambulatory Visit: Payer: Self-pay

## 2017-10-12 ENCOUNTER — Encounter: Payer: Self-pay | Admitting: Physician Assistant

## 2017-10-12 ENCOUNTER — Ambulatory Visit (INDEPENDENT_AMBULATORY_CARE_PROVIDER_SITE_OTHER): Payer: BLUE CROSS/BLUE SHIELD | Admitting: Physician Assistant

## 2017-10-12 VITALS — BP 119/79 | HR 87 | Temp 98.4°F | Resp 16 | Ht 66.0 in | Wt 164.4 lb

## 2017-10-12 DIAGNOSIS — J22 Unspecified acute lower respiratory infection: Secondary | ICD-10-CM

## 2017-10-12 DIAGNOSIS — R059 Cough, unspecified: Secondary | ICD-10-CM

## 2017-10-12 DIAGNOSIS — R05 Cough: Secondary | ICD-10-CM

## 2017-10-12 MED ORDER — BENZONATATE 100 MG PO CAPS: 100.0000 mg | ORAL_CAPSULE | Freq: Three times a day (TID) | ORAL | 0 refills | Status: DC | PRN

## 2017-10-12 MED ORDER — AZITHROMYCIN 250 MG PO TABS: ORAL_TABLET | ORAL | 0 refills | Status: DC

## 2017-10-12 MED ORDER — HYDROCODONE-HOMATROPINE 5-1.5 MG/5ML PO SYRP: 5.0000 mL | ORAL_SOLUTION | Freq: Three times a day (TID) | ORAL | 0 refills | Status: DC | PRN

## 2017-10-12 NOTE — Patient Instructions (Addendum)
Azithromycin is an antibiotic. Take the entire course of this medication, even if you start to feel better sooner.  Tessalon is for cough during the day. This should not make you drowsy.  Hycodan is to help your cough at night. This will make you drowsy. Do not take this with any other medications that make your drowsy.  Mucinex will help thin out your mucus to help you clear it out. Drink plenty of water while taking this medication.  Cepacol throat lozenges   Stay well hydrated. Get lost of rest. Wash your hands often.   -Foods that can help speed recovery: honey, garlic, chicken soup, elderberries, green tea.  -Supplements that can help speed recovery: vitamin C, zinc, elderberry extract, quercetin, ginseng, selenium -Supplement with prebiotics and probiotics:   Advil or ibuprofen for pain. Do not take Aspirin.  Drink enough water and fluids to keep your urine clear or pale yellow.  For sore throat: ? Gargle with 8 oz of salt water ( tsp of salt per 1 qt of water) as often as every 1-2 hours to soothe your throat.    For sore throat try using a honey-based tea. Use 3 teaspoons of honey with juice squeezed from half lemon. Place shaved pieces of ginger into 1/2-1 cup of water and warm over stove top. Then mix the ingredients and repeat every 4 hours as needed.  Cough Syrup Recipe: Sweet Lemon & Honey Thyme  Ingredients . a handful of fresh thyme sprigs   . 1 pint of water (2 cups)  . 1/2 cup honey (raw is best, but regular will do)  . 1/2 lemon chopped Instructions 1. Place the lemon in the pint jar and cover with the honey. The honey will macerate the lemons and draw out liquids which taste so delicious! 2. Meanwhile, toss the thyme leaves into a saucepan and cover them with the water. 3. Bring the water to a gentle simmer and reduce it to half, about a cup of tea. 4. When the tea is reduced and cooled a bit, strain the sprigs & leaves, add it into the pint jar and stir it  well. 5. Give it a shake and use a spoonful as needed. 6. Store your homemade cough syrup in the refrigerator for about a month.  What causes a cough?  In adults, common causes of a cough include: ?An infection of the airways or lungs (such as the common cold) ?Postnasal drip - Postnasal drip is when mucus from the nose drips down or flows along the back of the throat. Postnasal drip can happen when people have: .A cold .Allergies .A sinus infection - The sinuses are hollow areas in the bones of the face that open into the nose. ?Lung conditions, like asthma and chronic obstructive pulmonary disease (COPD) - Both of these conditions can make it hard to breathe. COPD is usually caused by smoking. ?Acid reflux - Acid reflux is when the acid that is normally in your stomach backs up into your esophagus (the tube that carries food from your mouth to your stomach). ?A side effect from blood pressure medicines called "ACE inhibitors" ?Smoking cigarettes  Is there anything I can do on my own to get rid of my cough?  Yes. To help get rid of your cough, you can: ?Use a humidifier in your bedroom ?Use an over-the-counter cough medicine, or suck on cough drops or hard candy ?Stop smoking, if you smoke ?If you have allergies, avoid the things you are allergic  to (like pollen, dust, animals, or mold) If you have acid reflux, your doctor or nurse will tell you which lifestyle changes can help reduce symptoms.     IF you received an x-ray today, you will receive an invoice from [facility]. Please contact [facility] at [phone] with questions or concerns regarding your invoice.   IF you received labwork today, you will receive an invoice from [facility]. Please contact [facility] at [phone] with questions or concerns regarding your invoice.   Our billing staff will not be able to assist you with questions regarding bills from these companies.  You will be contacted with the  lab results as soon as they are available. The fastest way to get your results is to activate your My Chart account. Instructions are located on the last page of this paperwork. If you have not heard from [place] regarding the results in 2 weeks, please contact this office.

## 2017-10-12 NOTE — Progress Notes (Signed)
Stacy Rivera  MRN: [id number] DOB: 27-Sep-1980  PCP: Patient, No Pcp Per  Subjective:  Pt is a 37 year old female who presents to clinic for cough, congestion and fatigue.  Cough and cold x 1 month. Endorses congestion of her nose and chest with clear drainage.  Endorses fatigue x 3 days. Symptoms worsened with low grade fever and sleeping a lot.  She has taken ibuprofen, teas and steamy baths  Review of Systems  Constitutional: Positive for fatigue. Negative for chills, diaphoresis and fever.  HENT: Positive for congestion. Negative for postnasal drip, rhinorrhea, sinus pressure, sinus pain and sore throat.   Respiratory: Positive for cough. Negative for shortness of breath and wheezing.   Psychiatric/Behavioral: Negative for sleep disturbance.    Patient Active Problem List   Diagnosis Date Noted  . Post-dates pregnancy 07/24/2016  . SVD (spontaneous vaginal delivery) 07/24/2016  . Postpartum care following vaginal delivery (7/16) 07/24/2016  . Advanced maternal age in multigravida 01/11/2016    Current Outpatient Medications on File Prior to Visit  Medication Sig Dispense Refill  . FLUoxetine (PROZAC) 20 MG tablet Take 20 mg by mouth daily.    Marland Kitchen levETIRAcetam (KEPPRA) 750 MG tablet Take 1,125 mg by mouth 2 (two) times daily. Take 1.5 tablets twice daily.    . Docosahexaenoic Acid (DHA COMPLETE PO) Take 1 capsule by mouth every morning.     . Prenatal Vit w/Fe-Methylfol-FA (PNV PO) Take 1 capsule by mouth every morning.     . ranitidine (ZANTAC) 75 MG tablet Take 75 mg by mouth at bedtime as needed for heartburn.     No current facility-administered medications on file prior to visit.     Allergies  Allergen Reactions  . Dairy Aid [Lactase] Diarrhea     Objective:  BP 119/79 (BP Location: Right Arm, Patient Position: Sitting, Cuff Size: Normal)   Pulse 87   Temp 98.4 F (36.9 C) (Oral)   Resp 16   Ht 5\' 6"  (1.676 m)   Wt 164 lb 6.4 oz (74.6 kg)   LMP 09/21/2017    SpO2 98%   BMI 26.53 kg/m   Physical Exam  Constitutional: She is oriented to person, place, and time. No distress.  HENT:  Right Ear: Tympanic membrane normal.  Left Ear: Tympanic membrane normal.  Nose: Mucosal edema present. No rhinorrhea. Right sinus exhibits no maxillary sinus tenderness and no frontal sinus tenderness. Left sinus exhibits no maxillary sinus tenderness and no frontal sinus tenderness.  Mouth/Throat: Oropharynx is clear and moist and mucous membranes are normal.  Cardiovascular: Normal rate, regular rhythm and normal heart sounds.  Pulmonary/Chest: Effort normal and breath sounds normal. No respiratory distress. She has no wheezes. She has no rales.  Neurological: She is alert and oriented to person, place, and time.  Skin: Skin is warm and dry.  Psychiatric: Judgment normal.  Vitals reviewed.   Assessment and Plan :  1. Lower respiratory infection - azithromycin (ZITHROMAX) 250 MG tablet; Take 2 tabs PO x 1 dose, then 1 tab PO QD x 4 days  Dispense: 6 tablet; Refill: 0  2. Cough - HYDROcodone-homatropine (HYCODAN) 5-1.5 MG/5ML syrup; Take 5 mLs by mouth every 8 (eight) hours as needed for cough.  Dispense: 120 mL; Refill: 0 - benzonatate (TESSALON) 100 MG capsule; Take 1-2 capsules (100-200 mg total) by mouth 3 (three) times daily as needed for cough.  Dispense: 40 capsule; Refill: 0   Whitney Caree Wolpert, PA-C  Primary Care at [facility] 10/12/2017 3:57 PM  Please note: Portions of this report may have been transcribed using dragon voice recognition software. Every effort was made to ensure accuracy; however, inadvertent computerized transcription errors may be present.

## 2017-10-25 DIAGNOSIS — F4322 Adjustment disorder with anxiety: Secondary | ICD-10-CM | POA: Diagnosis not present

## 2017-10-31 DIAGNOSIS — F4322 Adjustment disorder with anxiety: Secondary | ICD-10-CM | POA: Diagnosis not present

## 2017-11-28 DIAGNOSIS — F4322 Adjustment disorder with anxiety: Secondary | ICD-10-CM | POA: Diagnosis not present

## 2017-12-13 DIAGNOSIS — F4322 Adjustment disorder with anxiety: Secondary | ICD-10-CM | POA: Diagnosis not present

## 2017-12-26 DIAGNOSIS — F4322 Adjustment disorder with anxiety: Secondary | ICD-10-CM | POA: Diagnosis not present

## 2018-01-17 ENCOUNTER — Ambulatory Visit (HOSPITAL_COMMUNITY)
Admission: EM | Admit: 2018-01-17 | Discharge: 2018-01-17 | Disposition: A | Discharge status: Home / Self Care | Payer: BLUE CROSS/BLUE SHIELD | Attending: Family Medicine | Admitting: Family Medicine

## 2018-01-17 ENCOUNTER — Other Ambulatory Visit: Payer: Self-pay

## 2018-01-17 ENCOUNTER — Encounter (HOSPITAL_COMMUNITY): Payer: Self-pay | Admitting: Emergency Medicine

## 2018-01-17 DIAGNOSIS — R109 Unspecified abdominal pain: Secondary | ICD-10-CM | POA: Insufficient documentation

## 2018-01-17 DIAGNOSIS — Z3202 Encounter for pregnancy test, result negative: Secondary | ICD-10-CM

## 2018-01-17 LAB — POCT PREGNANCY, URINE: Preg Test, Ur: NEGATIVE

## 2018-01-17 LAB — POCT URINALYSIS DIP (DEVICE)
Bilirubin Urine: NEGATIVE
Glucose, UA: NEGATIVE mg/dL
KETONES UR: NEGATIVE mg/dL
Leukocytes, UA: NEGATIVE
NITRITE: NEGATIVE
PROTEIN: NEGATIVE mg/dL
Specific Gravity, Urine: 1.01 (ref 1.005–1.030)
Urobilinogen, UA: 0.2 mg/dL (ref 0.0–1.0)
pH: 6 (ref 5.0–8.0)

## 2018-01-17 NOTE — Discharge Instructions (Signed)
You have been seen today for flank pain and abdominal pain. Your evaluation was not suggestive of any emergent condition requiring medical intervention at this time. However, some abdominal or back problems make take more time to appear. Therefore, it's very important for you to pay attention to any new symptoms or worsening of your current condition.  Please return here or to the Emergency Department immediately should you feel worse in any way or have any of the following symptoms: increasing or different abdominal pain, persistent vomiting, fevers, or shaking chills.

## 2018-01-17 NOTE — ED Triage Notes (Addendum)
PT reports right flank pain that wraps around to right abdomen. Slightly more vaginal discharge than usual yesterday.   PT wonders if it is a UTI, but has no dysuria.   PT has also been working out lately, but that has been a normal occurrence for last 6 weeks.

## 2018-01-22 NOTE — ED Provider Notes (Signed)
[facility]   [id number] 01/17/18 Arrival Time: 1605  ASSESSMENT & PLAN:  1. Right flank pain    Without trauma, there is no indication for imaging of back at this time. No sign of serious illness or infection. Normal neurological exam. Discussed. Very likely this is muscular and related to her increased physical activity over the past several weeks. Prefers trial of OTC ibuprofen with food for several days. Activities as tolerated.  Follow-up Information    [facility].   Specialty:  Urgent Care Why:  If symptoms worsen. Contact information: [address] [place] [address] [phone]         Reviewed expectations re: course of current medical issues. Questions answered. Outlined signs and symptoms indicating need for more acute intervention. Patient verbalized understanding. After Visit Summary given.   SUBJECTIVE: History from: patient.  Stacy Rivera is a 38 y.o. female who presents with complaint of right sided flank pain. Onset gradual, over the past few days. Injury/trama: no, but questions if related to increasing her workout routine over the past 5-6 weeks. History of back problems: no. Discomfort described as dull and aching without radiation. Certain movements do exacerbate the described discomfort. Better with rest. Extremity sensation changes or weakness: none. Ambulatory without difficulty. Normal bowel/bladder habits: no. No associated abdominal pain/n/v. Self treatment: has tried OTC analgesics with only mild relief.  Reports no fevers, IV drug use, or recent back surgeries or procedures.  ROS: As per HPI. All other systems negative    OBJECTIVE:  Vitals:   01/17/18 1625  BP: 127/82  Pulse: 72  Resp: 18  Temp: 98.4 F (36.9 C)  TempSrc: Oral  SpO2: 100%    General appearance: alert; no distress Neck: supple with FROM; without midline tenderness CV: RRR without murmer Lungs:  unlabored respirations; symmetrical air entry Abdomen: soft, non-tender; non-distended Back: mild bilateral tenderness of her lower paraspinal musculature; FROM at waist; bruising: none; without midline tenderness Extremities: no edema; symmetrical with no gross deformities; normal ROM of bilateral lower extremities Skin: warm and dry Neurologic: normal gait; normal reflexes of RLE and LLE; normal sensation of RLE and LLE; normal strength of RLE and LLE Psychological: alert and cooperative; normal mood and affect  Labs: Results for orders placed or performed during the hospital encounter of 01/17/18  POCT urinalysis dip (device)  Result Value Ref Range   Glucose, UA NEGATIVE NEGATIVE mg/dL   Bilirubin Urine NEGATIVE NEGATIVE   Ketones, ur NEGATIVE NEGATIVE mg/dL   Specific Gravity, Urine 1.010 1.005 - 1.030   Hgb urine dipstick TRACE (A) NEGATIVE   pH 6.0 5.0 - 8.0   Protein, ur NEGATIVE NEGATIVE mg/dL   Urobilinogen, UA 0.2 0.0 - 1.0 mg/dL   Nitrite NEGATIVE NEGATIVE   Leukocytes, UA NEGATIVE NEGATIVE  Pregnancy, urine POC  Result Value Ref Range   Preg Test, Ur NEGATIVE NEGATIVE   Labs Reviewed  POCT URINALYSIS DIP (DEVICE) - Abnormal; Notable for the following components:      Result Value   Hgb urine dipstick TRACE (*)    All other components within normal limits  POC URINE PREG, ED  POCT PREGNANCY, URINE    Allergies  Allergen Reactions  . Dairy Aid [Lactase] Diarrhea    Past Medical History:  Diagnosis Date  . Anxiety   . Depression   . Seizures (HCC)    2011   Social History   Socioeconomic History  . Marital status: Married  Spouse name: Not on file  . Number of children: Not on file  . Years of education: Not on file  . Highest education level: Not on file  Occupational History  . Not on file  Social Needs  . Financial resource strain: Not on file  . Food insecurity:    Worry: Not on file    Inability: Not on file  . Transportation needs:     Medical: Not on file    Non-medical: Not on file  Tobacco Use  . Smoking status: Never Smoker  . Smokeless tobacco: Never Used  Substance and Sexual Activity  . Alcohol use: No  . Drug use: No  . Sexual activity: Yes    Birth control/protection: None  Lifestyle  . Physical activity:    Days per week: Not on file    Minutes per session: Not on file  . Stress: Not on file  Relationships  . Social connections:    Talks on phone: Not on file    Gets together: Not on file    Attends religious service: Not on file    Active member of club or organization: Not on file    Attends meetings of clubs or organizations: Not on file    Relationship status: Not on file  . Intimate partner violence:    Fear of current or ex partner: Not on file    Emotionally abused: Not on file    Physically abused: Not on file    Forced sexual activity: Not on file  Other Topics Concern  . Not on file  Social History Narrative  . Not on file   Family History  Problem Relation Age of Onset  . Hyperlipidemia Father   . Hypertension Sister   . Cancer Maternal Grandmother        colon  . Heart disease Maternal Grandfather   . Diabetes Paternal Grandmother   . Heart disease Paternal Grandfather    Past Surgical History:  Procedure Laterality Date  . BRAIN SURGERY  2006     Mardella Layman, MD 02/06/18 [phone]

## 2018-01-29 DIAGNOSIS — F4322 Adjustment disorder with anxiety: Secondary | ICD-10-CM | POA: Diagnosis not present

## 2018-02-12 DIAGNOSIS — F4322 Adjustment disorder with anxiety: Secondary | ICD-10-CM | POA: Diagnosis not present

## 2018-03-07 DIAGNOSIS — F4322 Adjustment disorder with anxiety: Secondary | ICD-10-CM | POA: Diagnosis not present

## 2018-03-28 DIAGNOSIS — F4322 Adjustment disorder with anxiety: Secondary | ICD-10-CM | POA: Diagnosis not present

## 2018-04-11 DIAGNOSIS — F4322 Adjustment disorder with anxiety: Secondary | ICD-10-CM | POA: Diagnosis not present

## 2018-04-17 DIAGNOSIS — F4322 Adjustment disorder with anxiety: Secondary | ICD-10-CM | POA: Diagnosis not present

## 2018-04-22 ENCOUNTER — Encounter: Payer: Self-pay | Admitting: Physician Assistant

## 2018-04-22 ENCOUNTER — Telehealth: Payer: BLUE CROSS/BLUE SHIELD | Admitting: Physician Assistant

## 2018-04-22 DIAGNOSIS — R51 Headache: Secondary | ICD-10-CM

## 2018-04-22 DIAGNOSIS — R52 Pain, unspecified: Secondary | ICD-10-CM

## 2018-04-22 DIAGNOSIS — R509 Fever, unspecified: Secondary | ICD-10-CM

## 2018-04-22 DIAGNOSIS — R519 Headache, unspecified: Secondary | ICD-10-CM

## 2018-04-22 DIAGNOSIS — R059 Cough, unspecified: Secondary | ICD-10-CM

## 2018-04-22 DIAGNOSIS — R05 Cough: Secondary | ICD-10-CM

## 2018-04-22 MED ORDER — BENZONATATE 100 MG PO CAPS: 100.0000 mg | ORAL_CAPSULE | Freq: Two times a day (BID) | ORAL | 0 refills | Status: DC | PRN

## 2018-04-22 NOTE — Progress Notes (Signed)
E-Visit for Corona Virus Screening  Based on your current symptoms, you may very well have the virus, however your symptoms are mild. Currently, not all patients are being tested. If the symptoms are mild and there is not a known exposure, performing the test is not indicated.  Coronavirus disease 2019 (COVID-19) is a respiratory illness that can spread from person to person. The virus that causes COVID-19 is a new virus that was first identified in the country of [place] but is now found in multiple other countries and has spread to the [place].  Symptoms associated with the virus are mild to severe fever, cough, and shortness of breath. There is currently no vaccine to protect against COVID-19, and there is no specific antiviral treatment for the virus.   To be considered HIGH RISK for Coronavirus (COVID-19), you have to meet the following criteria:  . Traveled to [place], [place], [place], [place] or [place]; or in the [place] to [place], [place], [place], or [place]; and have fever, cough, and shortness of breath within the last 2 weeks of travel OR  . Been in close contact with a person diagnosed with COVID-19 within the last 2 weeks and have fever, cough, and shortness of breath  . IF YOU DO NOT MEET THESE CRITERIA, YOU ARE CONSIDERED LOW RISK FOR COVID-19.   It is vitally important that if you feel that you have an infection such as this virus or any other virus that you stay home and away from places where you may spread it to others.  You should self-quarantine for 14 days if you have symptoms that could potentially be coronavirus and avoid contact with people age 65 and older.   You can use medication such as A prescription cough medication called Tessalon Perles 100 mg. You may take 1-2 capsules every 8 hours as needed for cough  You may also take acetaminophen (Tylenol) as needed for fever.   Reduce your risk of any infection by using the same precautions used for  avoiding the common cold or flu:  . Wash your hands often with soap and warm water for at least 20 seconds.  If soap and water are not readily available, use an alcohol-based hand sanitizer with at least 60% alcohol.  . If coughing or sneezing, cover your mouth and nose by coughing or sneezing into the elbow areas of your shirt or coat, into a tissue or into your sleeve (not your hands). . Avoid shaking hands with others and consider head nods or verbal greetings only. . Avoid touching your eyes, nose, or mouth with unwashed hands.  . Avoid close contact with people who are sick. . Avoid places or events with large numbers of people in one location, like concerts or sporting events. . Carefully consider travel plans you have or are making. . If you are planning any travel outside or inside the [place], visit the CDC's Travelers' Health webpage for the latest health notices. . If you have some symptoms but not all symptoms, continue to monitor at home and seek medical attention if your symptoms worsen. . If you are having a medical emergency, call 911.  HOME CARE . Only take medications as instructed by your medical team. . Drink plenty of fluids and get plenty of rest. . A steam or ultrasonic humidifier can help if you have congestion.   GET HELP RIGHT AWAY IF: . You develop worsening fever. . You become short of breath . You cough   up blood. . Your symptoms become more severe MAKE SURE YOU   Understand these instructions.  Will watch your condition.  Will get help right away if you are not doing well or get worse.  Your e-visit answers were reviewed by a board certified advanced clinical practitioner to complete your personal care plan.  Depending on the condition, your plan could have included both over the counter or prescription medications.  If there is a problem please reply once you have received a response from your provider. Your safety is important to us.  If you have drug  allergies check your prescription carefully.    You can use MyChart to ask questions about today's visit, request a non-urgent call back, or ask for a work or school excuse for 24 hours related to this e-Visit. If it has been greater than 24 hours you will need to follow up with your provider, or enter a new e-Visit to address those concerns. You will get an e-mail in the next two days asking about your experience.  I hope that your e-visit has been valuable and will speed your recovery. Thank you for using e-visits.   I have spent 7 min in completion and review of this note-   PAC  

## 2018-04-26 ENCOUNTER — Telehealth: Payer: BLUE CROSS/BLUE SHIELD | Admitting: Family

## 2018-04-26 DIAGNOSIS — R05 Cough: Secondary | ICD-10-CM

## 2018-04-26 DIAGNOSIS — R059 Cough, unspecified: Secondary | ICD-10-CM

## 2018-04-26 MED ORDER — PROMETHAZINE-DM 6.25-15 MG/5ML PO SYRP: 5.0000 mL | ORAL_SOLUTION | Freq: Four times a day (QID) | ORAL | 0 refills | Status: DC | PRN

## 2018-04-26 NOTE — Progress Notes (Signed)

## 2018-05-02 DIAGNOSIS — F4322 Adjustment disorder with anxiety: Secondary | ICD-10-CM | POA: Diagnosis not present

## 2018-05-28 DIAGNOSIS — Z03818 Encounter for observation for suspected exposure to other biological agents ruled out: Secondary | ICD-10-CM | POA: Diagnosis not present

## 2018-06-06 DIAGNOSIS — F4322 Adjustment disorder with anxiety: Secondary | ICD-10-CM | POA: Diagnosis not present

## 2018-06-28 NOTE — Progress Notes (Signed)
Greater than 5 minutes, yet less than 10 minutes of time have been spent researching, coordinating, and implementing care for this patient today.  Thank you for the details you included in the comment boxes. Those details are very helpful in determining the best course of treatment for you and help us to provide the best care.  

## 2018-08-01 DIAGNOSIS — F4322 Adjustment disorder with anxiety: Secondary | ICD-10-CM | POA: Diagnosis not present

## 2018-08-15 ENCOUNTER — Other Ambulatory Visit: Payer: Self-pay

## 2018-08-15 DIAGNOSIS — Z20822 Contact with and (suspected) exposure to covid-19: Secondary | ICD-10-CM

## 2018-08-16 LAB — NOVEL CORONAVIRUS, NAA: SARS-CoV-2, NAA: NOT DETECTED

## 2018-08-27 ENCOUNTER — Other Ambulatory Visit: Payer: Self-pay

## 2018-08-27 DIAGNOSIS — Z20822 Contact with and (suspected) exposure to covid-19: Secondary | ICD-10-CM

## 2018-08-29 LAB — NOVEL CORONAVIRUS, NAA: SARS-CoV-2, NAA: NOT DETECTED

## 2018-11-04 ENCOUNTER — Other Ambulatory Visit: Payer: Self-pay

## 2018-11-04 DIAGNOSIS — Z20828 Contact with and (suspected) exposure to other viral communicable diseases: Secondary | ICD-10-CM | POA: Diagnosis not present

## 2018-11-04 DIAGNOSIS — Z20822 Contact with and (suspected) exposure to covid-19: Secondary | ICD-10-CM

## 2018-11-06 LAB — NOVEL CORONAVIRUS, NAA: SARS-CoV-2, NAA: NOT DETECTED

## 2018-11-19 ENCOUNTER — Other Ambulatory Visit: Payer: Self-pay

## 2018-11-19 DIAGNOSIS — Z20822 Contact with and (suspected) exposure to covid-19: Secondary | ICD-10-CM

## 2018-11-21 LAB — NOVEL CORONAVIRUS, NAA: SARS-CoV-2, NAA: NOT DETECTED

## 2018-12-20 ENCOUNTER — Other Ambulatory Visit: Payer: Self-pay

## 2018-12-20 DIAGNOSIS — Z20822 Contact with and (suspected) exposure to covid-19: Secondary | ICD-10-CM

## 2018-12-22 LAB — NOVEL CORONAVIRUS, NAA: SARS-CoV-2, NAA: NOT DETECTED

## 2019-02-12 IMAGING — US US MFM OB DETAIL+14 WK
1 series · 13 of 28 positions shown · non-contrast
Comparison: none

[Series 1: us mfm ob detail+14 wk · 128 acquisitions, 13 frames shown]
[im 5/128]
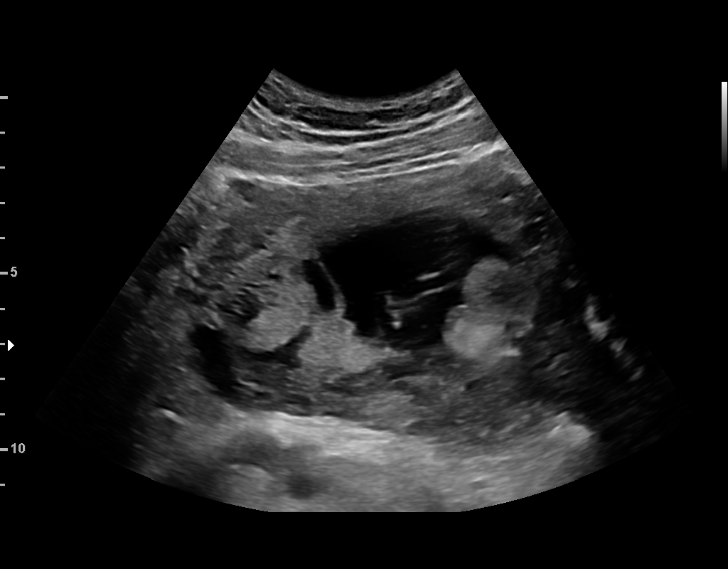
[im 15/128]
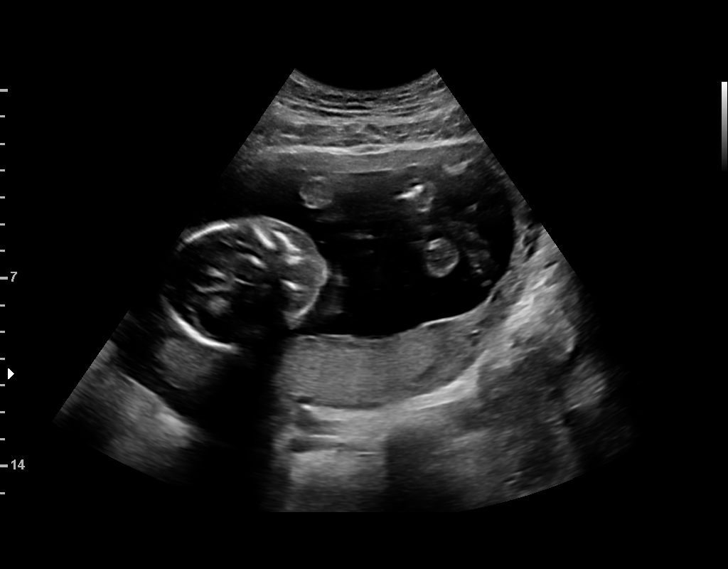
[im 24/128]
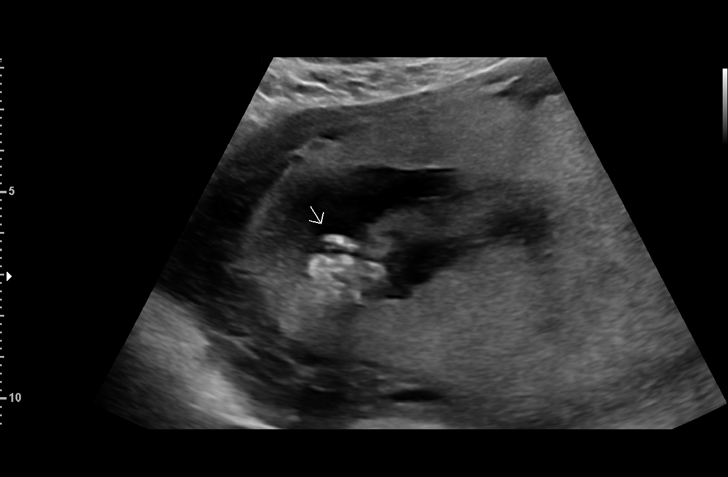
[im 33/128]
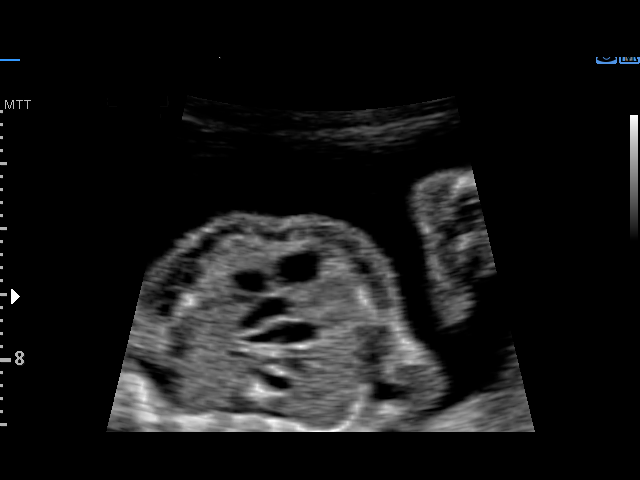
[im 43/128]
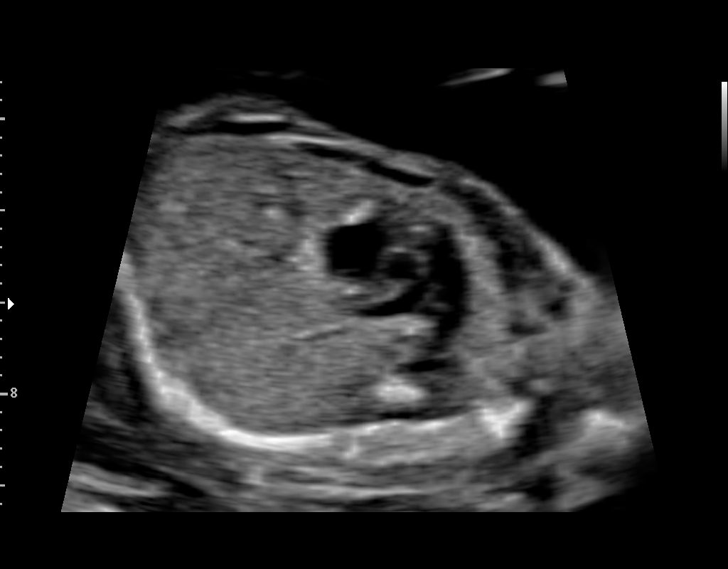
[im 52/128]
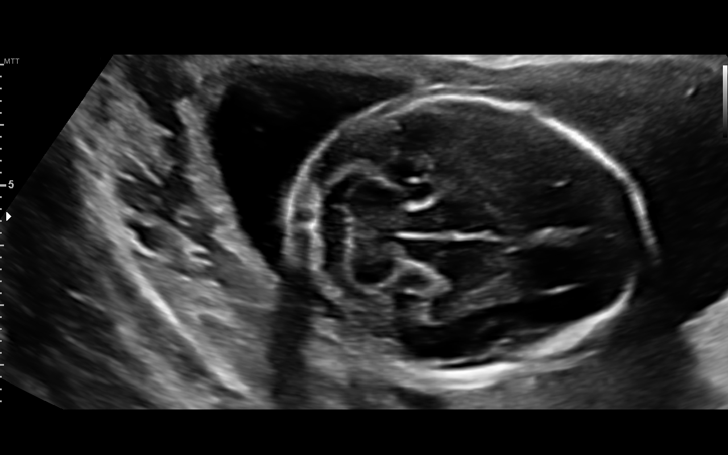
[im 66/128]
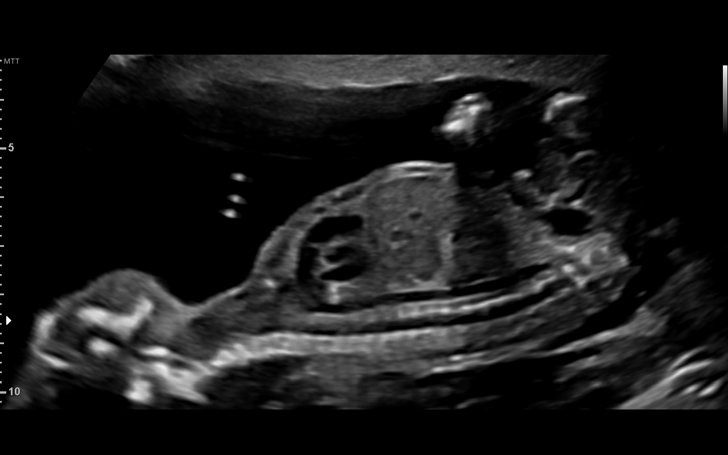
[im 76/128]
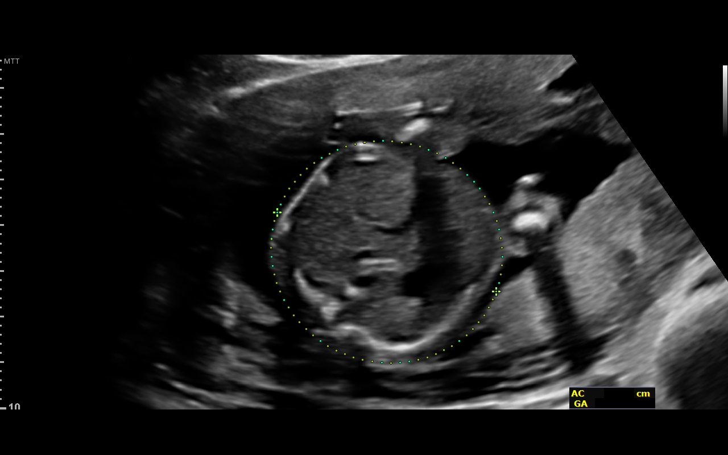
[im 85/128]
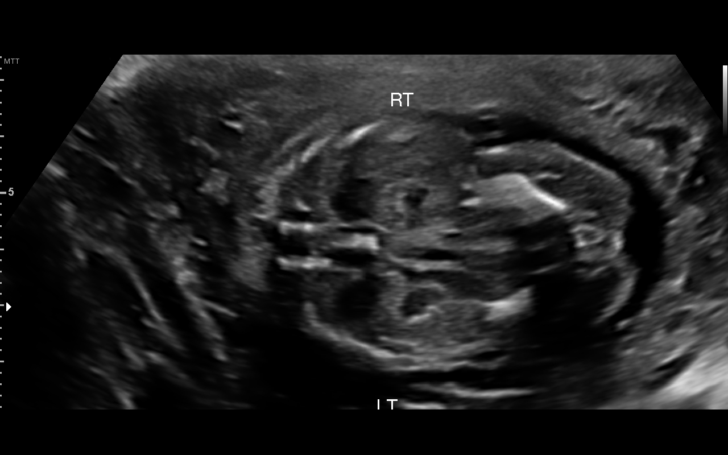
[im 95/128]
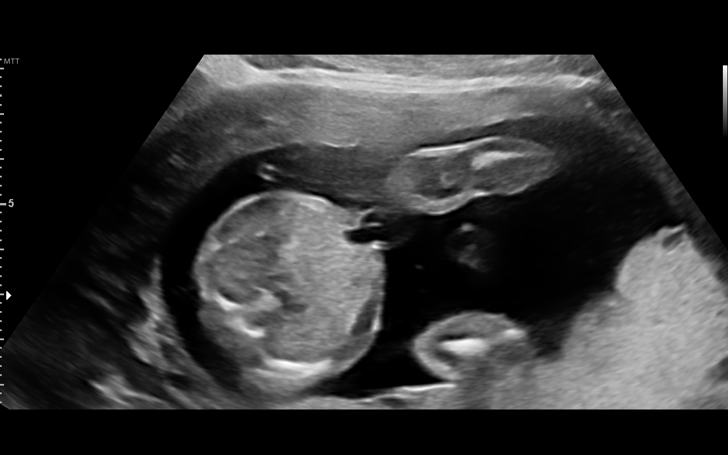
[im 104/128]
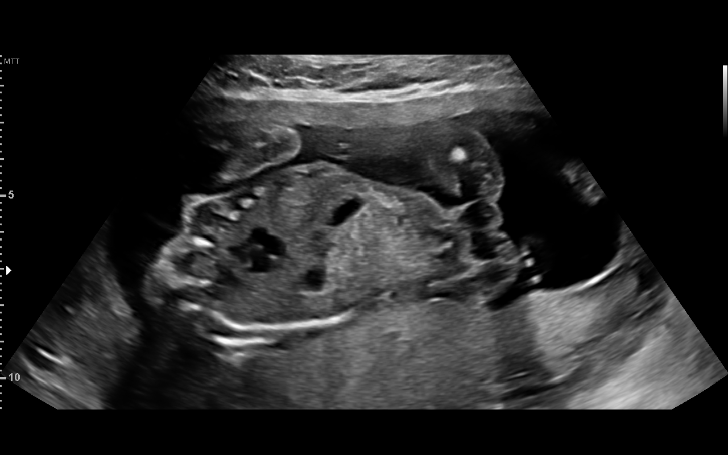
[im 113/128]
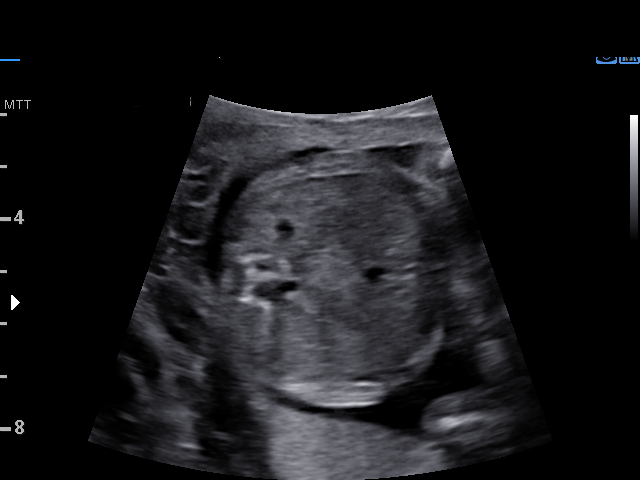
[im 123/128]
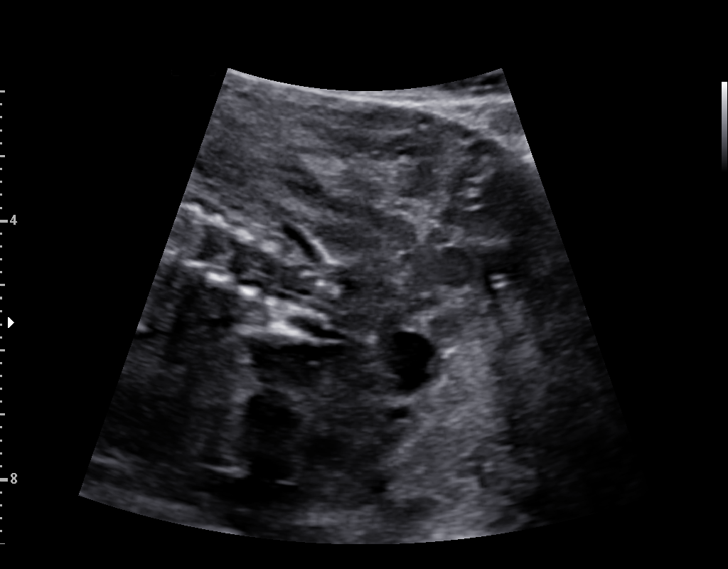

[13 of 28 positions shown; findings below may reference images not displayed]

Ref. Address:     [facility]
[REDACTED]
[address] [HOSPITAL]

[address]                [id number]      [id number]     [id number]
Indications

19 weeks gestation of pregnancy
Antenatal screening for malformations
Drug use complicating pregnancy, second
trimester (Keppra)
Seizure disorder; S/P surgery at [facility] for
hemangioma
Advanced maternal age multigravida 35+,
second trimester; low risk NIPS
OB History

Blood Type:            Height:         Weight (lb):  173       BMI:
Gravidity:    3         Term:   1        Prem:   0         SAB:   1
TOP:          0       Ectopic:  0        Living: 1
Fetal Evaluation

Num Of Fetuses:     1
Fetal Heart         129
Rate(bpm):
Cardiac Activity:   Observed
Presentation:       Variable
Placenta:           Posterior, above cervical os
P. Cord Insertion:  Visualized, central

Amniotic Fluid
AFI FV:      Subjectively within normal limits
Largest Pocket(cm)
4.6
Biometry

BPD:      45.8  mm     G. Age:  19w 6d         79  %    CI:         77.07  %    70 - 86
FL/HC:       16.8  %    16.1 -
HC:      165.2  mm     G. Age:  19w 2d         47  %    HC/AC:       1.09       1.09 -
AC:      151.1  mm     G. Age:  20w 2d         81  %    FL/BPD:      60.5  %
FL:       27.7  mm     G. Age:  18w 3d         21  %    FL/AC:       18.3  %    20 - 24
HUM:        27  mm     G. Age:  18w 4d         36  %
CER:      19.3  mm     G. Age:  18w 5d         37  %
NFT:       3.7  mm
CM:        4.4  mm

Est. FW:     294   gm    0 lb 10 oz     51  %
Gestational Age

LMP:           19w 1d        Date:  10/11/15                 EDD:    07/17/16
U/S Today:     19w 3d                                        EDD:    07/15/16
Best:          19w 1d     Det. By:  LMP  (10/11/15)          EDD:    07/17/16
Anatomy

Cranium:               Appears normal         Aortic Arch:            Appears normal
Cavum:                 Appears normal         Ductal Arch:            Appears normal
Ventricles:            Appears normal         Diaphragm:              Appears normal
Choroid Plexus:        Appears normal         Stomach:                Appears normal, left
sided
Cerebellum:            Appears normal         Abdomen:                Appears normal
Posterior Fossa:       Appears normal         Abdominal Wall:         Appears nml (cord
insert, abd wall)
Nuchal Fold:           Appears normal         Cord Vessels:           Appears normal (3
vessel cord)
Face:                  Appears normal         Kidneys:                Appear normal
(orbits and profile)
Lips:                  Appears normal         Bladder:                Appears normal
Thoracic:              Appears normal         Spine:                  Appears normal
Heart:                 Appears normal         Upper Extremities:      Appears normal
(4CH, axis, and situs
RVOT:                  Appears normal         Lower Extremities:      Appears normal
LVOT:                  Appears normal

Other:  Fetus appears to be a male. Heels and 5th digit visualized. Nasal
bone visualized.
Cervix Uterus Adnexa

Cervix
Length:           3.78  cm.
Normal appearance by transabdominal scan.

Uterus
No abnormality visualized.

Left Ovary
Size(cm)     1.91   x   1.85   x  1.6       Vol(ml):  3
Within normal limits. No adnexal mass visualized.
Right Ovary
Not visualized. No adnexal mass visualized.

Cul De Sac:   No free fluid seen.

Adnexa:       No abnormality visualized.
Impression

SIUP at 19+1 weeks
Normal detailed fetal anatomy
Markers of aneuploidy: none
Normal amniotic fluid volume
Measurements consistent with LMP dating
Recommendations

Follow-up as clinically indicated

## 2019-03-07 ENCOUNTER — Ambulatory Visit: Payer: BC Managed Care – PPO | Attending: Internal Medicine

## 2019-03-07 DIAGNOSIS — Z20822 Contact with and (suspected) exposure to covid-19: Secondary | ICD-10-CM | POA: Diagnosis not present

## 2019-03-08 LAB — NOVEL CORONAVIRUS, NAA: SARS-CoV-2, NAA: NOT DETECTED

## 2019-03-21 ENCOUNTER — Ambulatory Visit: Payer: BC Managed Care – PPO | Attending: Internal Medicine

## 2019-03-21 DIAGNOSIS — Z23 Encounter for immunization: Secondary | ICD-10-CM

## 2019-03-21 NOTE — Progress Notes (Signed)
   Covid-19 Vaccination Clinic  Name:  Stacy Rivera    MRN: [id number] DOB: 1980/12/24  03/21/2019  [name] was observed post Covid-19 immunization for 15 minutes without incident. She was provided with Vaccine Information Sheet and instruction to access the V-Safe system.   [name] was instructed to call 911 with any severe reactions post vaccine: Marland Kitchen Difficulty breathing  . Swelling of face and throat  . A fast heartbeat  . A bad rash all over body  . Dizziness and weakness   Immunizations Administered    Name Date Dose VIS Date Route   Pfizer COVID-19 Vaccine 03/21/2019  9:39 AM 0.3 mL 12/20/2018 Intramuscular   Manufacturer: [employer], [employer]   Lot: [id number]   NDC: [address]-1

## 2019-04-14 ENCOUNTER — Ambulatory Visit: Payer: BC Managed Care – PPO | Attending: Internal Medicine

## 2019-04-14 DIAGNOSIS — Z23 Encounter for immunization: Secondary | ICD-10-CM

## 2019-04-14 NOTE — Progress Notes (Signed)
   Covid-19 Vaccination Clinic  Name:  SHANYCE DARIS    MRN: [id number] DOB: 1980/06/17  04/14/2019  Ms. Rieves was observed post Covid-19 immunization for 15 minutes without incident. She was provided with Vaccine Information Sheet and instruction to access the V-Safe system.   Ms. Ellerman was instructed to call 911 with any severe reactions post vaccine: Marland Kitchen Difficulty breathing  . Swelling of face and throat  . A fast heartbeat  . A bad rash all over body  . Dizziness and weakness   Immunizations Administered    Name Date Dose VIS Date Route   Pfizer COVID-19 Vaccine 04/14/2019 12:07 PM 0.3 mL 12/20/2018 Intramuscular   Manufacturer: [employer], [employer]   Lot: [id number]   NDC: [address]-6

## 2019-05-12 ENCOUNTER — Other Ambulatory Visit: Payer: BC Managed Care – PPO

## 2019-06-29 DIAGNOSIS — Z20822 Contact with and (suspected) exposure to covid-19: Secondary | ICD-10-CM | POA: Diagnosis not present

## 2019-06-29 DIAGNOSIS — J029 Acute pharyngitis, unspecified: Secondary | ICD-10-CM | POA: Diagnosis not present

## 2019-06-29 DIAGNOSIS — R5383 Other fatigue: Secondary | ICD-10-CM | POA: Diagnosis not present

## 2022-01-18 ENCOUNTER — Encounter (HOSPITAL_BASED_OUTPATIENT_CLINIC_OR_DEPARTMENT_OTHER): Payer: Self-pay

## 2022-01-18 DIAGNOSIS — R0683 Snoring: Secondary | ICD-10-CM

## 2022-01-18 DIAGNOSIS — R5383 Other fatigue: Secondary | ICD-10-CM

## 2022-02-07 ENCOUNTER — Encounter: Payer: Self-pay | Admitting: Pulmonary Disease

## 2022-02-07 ENCOUNTER — Ambulatory Visit (INDEPENDENT_AMBULATORY_CARE_PROVIDER_SITE_OTHER): Payer: Managed Care, Other (non HMO) | Admitting: Pulmonary Disease

## 2022-02-07 VITALS — BP 118/82 | HR 83 | Ht 66.0 in | Wt 164.0 lb

## 2022-02-07 DIAGNOSIS — R0683 Snoring: Secondary | ICD-10-CM

## 2022-02-07 NOTE — Progress Notes (Signed)
Stacy Rivera    [id number]    03/10/1980  Primary Care Physician:Patient, No Pcp Per  Referring Physician: Harrison Mons, [address] [place],  [place] [address]  Chief complaint:   Patient being seen for nonrestorative sleep and snoring  HPI:  Snoring worsening over the last 2 to 3 years  Usually goes to bed between 9 and 10 PM, takes about 30 minutes to an hour to fall asleep 1-3 awakenings Final wake up time about 6 AM  Weight has been relatively stable down about 5 pounds in the last couple years  She is concerned about sleep apnea Both parents did snore, has a brother with sleep apnea was on CPAP  She is not tired during the day, remains very active, exercises regularly  She had a sleep study in 2006-this was performed in relation to-brain surgery for benign lesion  She has 2 children 5 and 8, they do not seem to bother her sleep much  She does have headaches sometimes in the mornings, this is new She does have dryness of her mouth in the mornings Memory is good No difficulty concentrating  No driving issues  Never smoker  Outpatient Encounter Medications as of 02/07/2022  Medication Sig   FLUoxetine (PROZAC) 20 MG tablet Take 20 mg by mouth daily.   Levonorgestrel (SKYLA IU) by Intrauterine route.   [DISCONTINUED] azithromycin (ZITHROMAX) 250 MG tablet Take 2 tabs PO x 1 dose, then 1 tab PO QD x 4 days   [DISCONTINUED] benzonatate (TESSALON) 100 MG capsule Take 1-2 capsules (100-200 mg total) by mouth 3 (three) times daily as needed for cough.   [DISCONTINUED] benzonatate (TESSALON) 100 MG capsule Take 1 capsule (100 mg total) by mouth 2 (two) times daily as needed for cough.   [DISCONTINUED] Docosahexaenoic Acid (DHA COMPLETE PO) Take 1 capsule by mouth every morning.    [DISCONTINUED] HYDROcodone-homatropine (HYCODAN) 5-1.5 MG/5ML syrup Take 5 mLs by mouth every 8 (eight) hours as needed for cough.   [DISCONTINUED]  levETIRAcetam (KEPPRA) 750 MG tablet Take 1,125 mg by mouth 2 (two) times daily. Take 1.5 tablets twice daily.   [DISCONTINUED] Prenatal Vit w/Fe-Methylfol-FA (PNV PO) Take 1 capsule by mouth every morning.    [DISCONTINUED] promethazine-dextromethorphan (PROMETHAZINE-DM) 6.25-15 MG/5ML syrup Take 5 mLs by mouth 4 (four) times daily as needed.   [DISCONTINUED] ranitidine (ZANTAC) 75 MG tablet Take 75 mg by mouth at bedtime as needed for heartburn.   No facility-administered encounter medications on file as of 02/07/2022.    Allergies as of 02/07/2022 - Review Complete 02/07/2022  Allergen Reaction Noted   Tilactase Diarrhea 02/22/2016    Past Medical History:  Diagnosis Date   Anxiety    Depression    Seizures ([place])    2011    Past Surgical History:  Procedure Laterality Date   BRAIN SURGERY  2006    Family History  Problem Relation Age of Onset   Hyperlipidemia Father    Hypertension Sister    Cancer Maternal Grandmother        colon   Heart disease Maternal Grandfather    Diabetes Paternal Grandmother    Heart disease Paternal Grandfather     Social History   Socioeconomic History   Marital status: Married    Spouse name: Not on file   Number of children: Not on file   Years of education: Not on file   Highest education level: Not on file  Occupational History   Not on file  Tobacco Use   Smoking status: Never   Smokeless tobacco: Never  Substance and Sexual Activity   Alcohol use: No   Drug use: No   Sexual activity: Yes    Birth control/protection: None  Other Topics Concern   Not on file  Social History Narrative   Not on file   Social Determinants of Health   Financial Resource Strain: Not on file  Food Insecurity: Not on file  Transportation Needs: Not on file  Physical Activity: Not on file  Stress: Not on file  Social Connections: Not on file  Intimate Partner Violence: Not on file    Review of Systems  Psychiatric/Behavioral:  Positive  for sleep disturbance.     Vitals:   02/07/22 0838  BP: 118/82  Pulse: 83  SpO2: 98%     Physical Exam Constitutional:      Appearance: Normal appearance.  HENT:     Head: Normocephalic.     Mouth/Throat:     Mouth: Mucous membranes are moist.  Eyes:     General: No scleral icterus. Cardiovascular:     Rate and Rhythm: Normal rate and regular rhythm.     Heart sounds: No murmur heard.    No friction rub.  Pulmonary:     Effort: No respiratory distress.     Breath sounds: No stridor. No wheezing or rhonchi.  Musculoskeletal:     Cervical back: No rigidity or tenderness.  Neurological:     Mental Status: She is alert.  Psychiatric:        Mood and Affect: Mood normal.        02/07/2022    8:00 AM  Results of the Epworth flowsheet  Sitting and reading 0  Watching TV 0  Sitting, inactive in a public place (e.g. a theatre or a meeting) 0  As a passenger in a car for an hour without a break 1  Lying down to rest in the afternoon when circumstances permit 2  Sitting and talking to someone 0  Sitting quietly after a lunch without alcohol 0  In a car, while stopped for a few minutes in traffic 0  Total score 3    Data Reviewed: Records from primary care referral reviewed - was seen by Harrison Mons  Assessment:  Nonrestorative sleep  Snoring  Mood disorder  Pathophysiology of sleep disordered breathing discussed with the patient Treatment options discussed with patient  Plan/Recommendations: Schedule patient for an in lab split-night study  Options of treatment for sleep apnea discussed  Patient currently uses an oral device, encouraged to titrate the oral device as tolerated  May need dental referral from optimized oral device as an option of treatment for snoring or mild sleep disordered breathing  Tentative follow-up in about 3 months  Encouraged to call with significant concerns   Sherrilyn Rist MD [facility] 02/07/2022, 8:44 AM  CC: Harrison Mons, PA

## 2022-02-07 NOTE — Patient Instructions (Signed)
Schedule for in lab split-night study  Continue to try to stay active on a regular basis Weight loss efforts  Sleeping with the head of bed elevated, sleeping on your side Adjusting your oral device may help with snoring and mild sleep apnea  Once the study is done, we will update you with findings  Call with significant concerns  Tentative follow-up in about 3 months

## 2022-05-10 ENCOUNTER — Encounter (HOSPITAL_BASED_OUTPATIENT_CLINIC_OR_DEPARTMENT_OTHER): Payer: Managed Care, Other (non HMO) | Admitting: Pulmonary Disease

## 2022-05-25 ENCOUNTER — Encounter (HOSPITAL_BASED_OUTPATIENT_CLINIC_OR_DEPARTMENT_OTHER): Payer: Managed Care, Other (non HMO) | Admitting: Pulmonary Disease

## 2022-06-13 ENCOUNTER — Ambulatory Visit (HOSPITAL_BASED_OUTPATIENT_CLINIC_OR_DEPARTMENT_OTHER): Payer: Managed Care, Other (non HMO) | Attending: Pulmonary Disease | Admitting: Pulmonary Disease

## 2022-06-13 VITALS — Ht 66.0 in | Wt 175.0 lb

## 2022-06-13 DIAGNOSIS — R0683 Snoring: Secondary | ICD-10-CM

## 2022-06-27 ENCOUNTER — Telehealth: Payer: Self-pay | Admitting: Pulmonary Disease

## 2022-06-27 DIAGNOSIS — R0683 Snoring: Secondary | ICD-10-CM

## 2022-06-27 NOTE — Procedures (Signed)
POLYSOMNOGRAPHY  Last, First: Stacy Rivera, Stacy Rivera MRN: [id number] Gender: Female Age (years): 77 Weight (lbs): 175 DOB: 15-Feb-1980 BMI: 28 Primary Care: No PCP Epworth Score: 3 Referring: Tomma Lightning MD Technician: Shelah Lewandowsky Interpreting: Tomma Lightning MD Study Type: NPSG Ordered Study Type: Split Night CPAP Study date: 06/13/2022 Location: [facility] CLINICAL INFORMATION Stacy Rivera is a 42 year old Female and was referred to the sleep center for evaluation of G47.30 Sleep Apnea, Unspecified (780.57). Indications include Excessive Daytime Sleepiness, Fatigue, Snoring.  MEDICATIONS Patient self administered medications include: N/A. Medications administered during study include No sleep medicine administered.  SLEEP STUDY TECHNIQUE A multi-channel overnight Polysomnography study was performed. The channels recorded and monitored were central and occipital EEG, electrooculogram (EOG), submentalis EMG (chin), nasal and oral airflow, thoracic and abdominal wall motion, anterior tibialis EMG, snore microphone, electrocardiogram, and a pulse oximetry. TECHNICIAN COMMENTS Comments added by Technician: NONE Comments added by Scorer: N/A SLEEP ARCHITECTURE The study was initiated at 10:35:36 PM and terminated at 5:15:07 AM. The total recorded time was 399.5 minutes. EEG confirmed total sleep time was 311.5 minutes yielding a sleep efficiency of 78.0%. Sleep onset after lights out was 67.2 minutes with a REM latency of 206.5 minutes. The patient spent 13.2% of the night in stage N1 sleep, 76.4% in stage N2 sleep, 0.0% in stage N3 and 10.4% in REM. Wake after sleep onset (WASO) was 20.9 minutes. The Arousal Index was 17.5/hour. RESPIRATORY PARAMETERS There were a total of 5 respiratory disturbances out of which 1 were apneas ( 0 obstructive, 0 mixed, 1 central) and 4 hypopneas. The apnea/hypopnea index (AHI) was 1.0 events/hour. The central sleep apnea index was 0.2 events/hour. The REM  AHI was 3.7 events/hour and NREM AHI was 0.6 events/hour. The supine AHI was 1.5 events/hour and the non supine AHI was 0.8 supine during 26.48% of sleep. Respiratory disturbances were associated with oxygen desaturation down to a nadir of 83.0% during sleep. The mean oxygen saturation during the study was 93.5%.  LEG MOVEMENT DATA The total leg movements were 0 with a resulting leg movement index of 0.0/hr .Associated arousal with leg movement index was 0.0/hr.  CARDIAC DATA The underlying cardiac rhythm was most consistent with sinus rhythm. Mean heart rate during sleep was 67.9 bpm. Additional rhythm abnormalities include None.  IMPRESSIONS - No Significant Obstructive Sleep apnea(OSA) - EKG showed no cardiac abnormalities. - The patient snored with soft snoring volume. - No significant periodic leg movements(PLMs) during sleep. However, no significant associated arousals.  DIAGNOSIS - Upper Airway Resistance Syndrome (G47.8) - Study was negative for significant sleep disordered breathing.  RECOMMENDATIONS - Avoid alcohol, sedatives and other CNS depressants that may worsen sleep apnea and disrupt normal sleep architecture. - Sleep hygiene should be reviewed to assess factors that may improve sleep quality. Optimize sleep hygiene - Other factors that may be contributing to non-restorative sleep should be further evaluated and treated. - Weight management and regular exercise should be initiated or continued.  [Electronically signed] 06/27/2022 05:20 AM  Virl Diamond MD NPI: [id number]

## 2022-06-27 NOTE — Telephone Encounter (Signed)
Call patient  Sleep study result  Date of study: 06/13/2022  Impression: Negative study for significant sleep disordered breathing  Recommendation:  Weight management and regular exercises  Other factors that may be contributing to nonrestorative sleep and fatigue should be further evaluated and treated  Clinical follow-up, follow-up as previously scheduled

## 2022-06-30 NOTE — Telephone Encounter (Signed)
ATC x1 LVM for patient to call our office regarding sleep study result's.

## 2022-07-05 NOTE — Telephone Encounter (Signed)
Sent Sleep study result's on mychart .  Nothing else further needed

## 2022-10-17 ENCOUNTER — Emergency Department (HOSPITAL_COMMUNITY)
Admission: EM | Admit: 2022-10-17 | Discharge: 2022-10-18 | Disposition: A | Discharge status: Home / Self Care | Payer: Managed Care, Other (non HMO) | Attending: Emergency Medicine | Admitting: Emergency Medicine

## 2022-10-17 ENCOUNTER — Other Ambulatory Visit: Payer: Self-pay

## 2022-10-17 ENCOUNTER — Encounter (HOSPITAL_COMMUNITY): Payer: Self-pay | Admitting: Emergency Medicine

## 2022-10-17 DIAGNOSIS — R0602 Shortness of breath: Secondary | ICD-10-CM

## 2022-10-17 DIAGNOSIS — I10 Essential (primary) hypertension: Secondary | ICD-10-CM | POA: Insufficient documentation

## 2022-10-17 DIAGNOSIS — M791 Myalgia, unspecified site: Secondary | ICD-10-CM | POA: Insufficient documentation

## 2022-10-17 DIAGNOSIS — Z8616 Personal history of COVID-19: Secondary | ICD-10-CM | POA: Insufficient documentation

## 2022-10-17 DIAGNOSIS — R7989 Other specified abnormal findings of blood chemistry: Secondary | ICD-10-CM | POA: Insufficient documentation

## 2022-10-17 NOTE — ED Provider Triage Note (Signed)
Emergency Medicine Provider Triage Evaluation Note  Stacy Rivera , a 42 y.o. female  was evaluated in triage.  Pt complains of sob, leg pain.  Review of Systems  Positive: sob Negative: Chest pain  Physical Exam  BP (!) 170/106 (BP Location: Right Arm)   Pulse 86   Temp 98.6 F (37 C) (Oral)   Resp 18   Wt 79.4 kg   SpO2 100%   BMI 28.25 kg/m  Gen:   Awake, no distress   Resp:  Normal effort  MSK:   Moves extremities without difficulty  Other:  Distal pulses intact  Medical Decision Making  Medically screening exam initiated at 11:35 PM.  Appropriate orders placed.  Stacy Rivera was informed that the remainder of the evaluation will be completed by another provider, this initial triage assessment does not replace that evaluation, and the importance of remaining in the ED until their evaluation is complete.  Pt found to have +D-dimer as outpatient.  Labs in [name]. CT chest ordered LE DVT study ordered as well    Stacy Rhine, MD 10/17/22 2336

## 2022-10-17 NOTE — ED Triage Notes (Signed)
Presents from home for PE work up per PCP.  PCP eval: BP at PCP 160/120, dimer elevated.  Recent injury to R leg.  Endorses new SOB, HA  Started new medication : amlodipine   Uses NuvaRing

## 2022-10-18 ENCOUNTER — Emergency Department (HOSPITAL_COMMUNITY): Payer: Managed Care, Other (non HMO)

## 2022-10-18 ENCOUNTER — Ambulatory Visit (HOSPITAL_BASED_OUTPATIENT_CLINIC_OR_DEPARTMENT_OTHER)
Admission: RE | Admit: 2022-10-18 | Discharge: 2022-10-18 | Disposition: A | Discharge status: Home / Self Care | Payer: Managed Care, Other (non HMO) | Source: Ambulatory Visit | Attending: Emergency Medicine | Admitting: Emergency Medicine

## 2022-10-18 DIAGNOSIS — R7989 Other specified abnormal findings of blood chemistry: Secondary | ICD-10-CM | POA: Diagnosis not present

## 2022-10-18 LAB — BASIC METABOLIC PANEL
Anion gap: 12 (ref 5–15)
BUN: 14 mg/dL (ref 6–20)
CO2: 20 mmol/L — ABNORMAL LOW (ref 22–32)
Calcium: 9.2 mg/dL (ref 8.9–10.3)
Chloride: 105 mmol/L (ref 98–111)
Creatinine, Ser: 0.76 mg/dL (ref 0.44–1.00)
GFR, Estimated: >60 mL/min (ref >60)
Glucose, Bld: 111 mg/dL — ABNORMAL HIGH (ref 70–99)
Potassium: 3.2 mmol/L — ABNORMAL LOW (ref 3.5–5.1)
Sodium: 137 mmol/L (ref 135–145)

## 2022-10-18 LAB — HCG, SERUM, QUALITATIVE: Preg, Serum: NEGATIVE

## 2022-10-18 MED ORDER — IOHEXOL 350 MG/ML SOLN
75.0000 mL | Freq: Once | INTRAVENOUS | Status: AC | PRN
  Administered 2022-10-18: 75 mL via INTRAVENOUS

## 2022-10-18 NOTE — Progress Notes (Signed)
Right lower extremity venous duplex has been completed. Preliminary results can be found in CV Proc through chart review.   10/18/22 11:16 AM Olen Cordial RVT

## 2022-10-18 NOTE — ED Provider Notes (Signed)
[place] [facility]ERGENCY DEPARTMENT AT [facility] Provider Note   CSN: [id number] Arrival date & time: 10/17/22  2247     History  Chief Complaint  Patient presents with   Shortness of Breath    Stacy Rivera is a 42 y.o. female.  The history is provided by the patient and a friend.  Patient with history of anxiety presents from PCP for elevated D-dimer.  Patient had recent injury to her right lower extremity and went in for evaluation and PCP & was concerned about a DVT.  Patient denies any previous history of VTE.  Patient also had recent episodes of fatigue, palpitations and mild shortness of breath.  No active chest pain or shortness of breath at this time    Past Medical History:  Diagnosis Date   Anxiety    Depression    Seizures (HCC)    2011    Home Medications Prior to Admission medications   Medication Sig Start Date End Date Taking? Authorizing Provider  FLUoxetine (PROZAC) 20 MG tablet Take 20 mg by mouth daily.    [provider]  Levonorgestrel (SKYLA IU) by Intrauterine route.    [provider]      Allergies    Tilactase    Review of Systems   Review of Systems  Constitutional:  Positive for fatigue.  Musculoskeletal:  Positive for myalgias.    Physical Exam Updated Vital Signs BP (!) 163/109 (BP Location: Right Arm)   Pulse 92   Temp 98.2 F (36.8 C)   Resp 16   Wt 79.4 kg   SpO2 100%   BMI 28.25 kg/m  Physical Exam CONSTITUTIONAL: Well developed/well nourished HEAD: Normocephalic/atraumatic ENMT: Mucous membranes moist NECK: supple no meningeal signs CV: Distal pulses intact in lower extremities LUNGS:no apparent distress ABDOMEN: soft NEURO: Pt is awake/alert/appropriate, moves all extremitiesx4.  No facial droop.   EXTREMITIES: pulses normal/equal, full ROM No significant right lower extremity SKIN: warm, color normal PSYCH: no abnormalities of mood noted, alert and oriented to situation  ED Results  / Procedures / Treatments   Labs (all labs ordered are listed, but only abnormal results are displayed) Labs Reviewed  BASIC METABOLIC PANEL - Abnormal; Notable for the following components:      Result Value   Potassium 3.2 (*)    CO2 20 (*)    Glucose, Bld 111 (*)    All other components within normal limits  HCG, SERUM, QUALITATIVE    EKG EKG Interpretation Date/Time:  Tuesday October 17 2022 23:16:23 EDT Ventricular Rate:  82 PR Interval:  158 QRS Duration:  90 QT Interval:  374 QTC Calculation: 436 R Axis:   27  Text Interpretation: Normal sinus rhythm Normal ECG No previous ECGs available Confirmed by Zadie Rhine ([id number]) on 10/17/2022 11:31:51 PM  Radiology CT Angio Chest PE W and/or Wo Contrast  Result Date: 10/18/2022 CLINICAL DATA:  Pulmonary embolism suspected, high probability. Shortness of breath, headache. Elevated D-dimer. EXAM: CT ANGIOGRAPHY CHEST WITH CONTRAST TECHNIQUE: Multidetector CT imaging of the chest was performed using the standard protocol during bolus administration of intravenous contrast. Multiplanar CT image reconstructions and MIPs were obtained to evaluate the vascular anatomy. RADIATION DOSE REDUCTION: This exam was performed according to the departmental dose-optimization program which includes automated exposure control, adjustment of the mA and/or kV according to patient size and/or use of iterative reconstruction technique. CONTRAST:  75mL OMNIPAQUE IOHEXOL 350 MG/ML SOLN COMPARISON:  None Available. FINDINGS: Cardiovascular: The heart is normal  in size and there is no pericardial effusion. The aorta and pulmonary trunk are normal in caliber. No pulmonary embolism is seen. Mediastinum/Nodes: No enlarged mediastinal, hilar, or axillary lymph nodes. Thyroid gland, trachea, and esophagus demonstrate no significant findings. Lungs/Pleura: Mild atelectasis is present bilaterally. No effusion or pneumothorax. Upper Abdomen: Fatty infiltration of the  liver is noted. No acute abnormality. Musculoskeletal: No acute osseous abnormality. Review of the MIP images confirms the above findings. IMPRESSION: 1. No evidence of pulmonary embolism or other acute process. 2. Hepatic steatosis. Electronically Signed   By: Thornell Sartorius M.D.   On: 10/18/2022 05:03    Procedures Procedures    Medications Ordered in ED Medications  iohexol (OMNIPAQUE) 350 MG/ML injection 75 mL (75 mLs Intravenous Contrast Given 10/18/22 0453)    ED Course/ Medical Decision Making/ A&P Clinical Course as of 10/18/22 0555  Wed Oct 18, 2022  1610 Patient was sent in due to an elevated D-dimer in the outpatient setting.  CT chest was negative.  Patient did have right lower extremity pain, therefore D-dimer elevation could be from DVT.  Patient will need outpatient DVT study [DW]  0554 Patient return later this morning for DVT study.  After discussion of risk and benefits, she declines Lovenox.  Patient was just prescribed amlodipine for newly diagnosed high blood pressure.  Her recent symptoms of fatigue and palpitations are likely due to elevated blood pressure [DW]    Clinical Course User Index [DW] Zadie Rhine, MD                                 Medical Decision Making Amount and/or Complexity of Data Reviewed Labs: ordered. Radiology: ordered.  Risk Prescription drug management.   This patient presents to the ED for concern of dyspnea, this involves an extensive number of treatment options, and is a complaint that carries with it a high risk of complications and morbidity.  The differential diagnosis includes but is not limited to Acute coronary syndrome, pneumonia, acute pulmonary edema, pneumothorax, acute anemia, pulmonary embolism   Comorbidities that complicate the patient evaluation: Patient's presentation is complicated by their history of anxiety  Social Determinants of Health: Patient's  recent diagnosis of hypertension   increases the  complexity of managing their presentation  Additional history obtained: Additional history obtained from  friend Records reviewed Care Everywhere/External Records  Lab Tests: I Ordered, and personally interpreted labs.  The pertinent results include: Labs overall unremarkable  Imaging Studies ordered: I ordered imaging studies including CT scan chest   I independently visualized and interpreted imaging which showed no acute findings I agree with the radiologist interpretation   Reevaluation: After the interventions noted above, I reevaluated the patient and found that they have :stayed the same  Complexity of problems addressed: Patient's presentation is most consistent with  acute presentation with potential threat to life or bodily function  Disposition: After consideration of the diagnostic results and the patient's response to treatment,  I feel that the patent would benefit from discharge   .           Final Clinical Impression(s) / ED Diagnoses Final diagnoses:  Shortness of breath  Muscle pain  Primary hypertension    Rx / DC Orders ED Discharge Orders          Ordered    LE VENOUS        10/17/22 2330  Zadie Rhine, MD 10/18/22 [phone]

## 2023-02-07 ENCOUNTER — Ambulatory Visit: Payer: Managed Care, Other (non HMO) | Admitting: Pulmonary Disease

## 2023-03-29 ENCOUNTER — Encounter: Payer: Self-pay | Admitting: Pulmonary Disease

## 2023-03-29 ENCOUNTER — Ambulatory Visit: Payer: Managed Care, Other (non HMO) | Admitting: Pulmonary Disease

## 2023-03-29 VITALS — BP 136/89 | HR 76 | Ht 66.0 in | Wt 176.0 lb

## 2023-03-29 DIAGNOSIS — R5383 Other fatigue: Secondary | ICD-10-CM

## 2023-03-29 DIAGNOSIS — I10 Essential (primary) hypertension: Secondary | ICD-10-CM | POA: Diagnosis not present

## 2023-03-29 DIAGNOSIS — R0683 Snoring: Secondary | ICD-10-CM

## 2023-03-29 NOTE — Patient Instructions (Signed)
 Continue graded activities as tolerated  Optimize sleep hygiene as possible  Follow-up as needed  The last sleep study is reassuring that you do not have significant sleep apnea.    significant weight gain, significant health issues is about the only things could make someone go from non significant sleep apnea to significant sleep apnea that needs treatment  Oral devices can help snoring  Continue graded activities as tolerated  Call with significant concerns

## 2023-03-29 NOTE — Progress Notes (Signed)
 Stacy Rivera    [id number]    December 03, 1980  Primary Care Physician:Jeffery, Avelino Leeds, PA  Referring Physician: Porfirio Oar, PA [address] [place],  [place] [address]  Chief complaint:   Patient being seen for nonrestorative sleep and snoring In for follow-up  HPI:  Seen about a year ago Did have a sleep study that was negative for significant sleep disordered breathing  Has since been diagnosed with hypertension, hypercholesterolemia  Still wakes up feeling tired  Ongoing snoring, uses a boil and bite oral device to help snoring  Weight has been stable Both parents snored   She does exercise regularly  She had a sleep study in 2006-this was performed in relation to-brain surgery for benign lesion  She has 2 children 5 and 8  memory is good No difficulty concentrating  No driving issues  Never smoker  Outpatient Encounter Medications as of 03/29/2023  Medication Sig   amLODipine (NORVASC) 10 MG tablet Take 10 mg by mouth daily.   FLUoxetine (PROZAC) 20 MG tablet Take 20 mg by mouth daily.   [DISCONTINUED] Levonorgestrel (SKYLA IU) by Intrauterine route.   No facility-administered encounter medications on file as of 03/29/2023.    Allergies as of 03/29/2023 - Review Complete 03/29/2023  Allergen Reaction Noted   Lactose Diarrhea 06/29/2019    Past Medical History:  Diagnosis Date   Anxiety    Depression    Seizures (HCC)    2011    Past Surgical History:  Procedure Laterality Date   BRAIN SURGERY  2006    Family History  Problem Relation Age of Onset   Hyperlipidemia Father    Hypertension Sister    Cancer Maternal Grandmother        colon   Heart disease Maternal Grandfather    Diabetes Paternal Grandmother    Heart disease Paternal Grandfather     Social History   Socioeconomic History   Marital status: Married    Spouse name: Not on file   Number of children: Not on file   Years of education: Not on  file   Highest education level: Not on file  Occupational History   Not on file  Tobacco Use   Smoking status: Never    Passive exposure: Never   Smokeless tobacco: Never  Substance and Sexual Activity   Alcohol use: No   Drug use: No   Sexual activity: Yes    Birth control/protection: None  Other Topics Concern   Not on file  Social History Narrative   Not on file   Social Drivers of Health   Financial Resource Strain: Low Risk  (02/27/2023)   [facility]   Overall Financial Resource Strain (CARDIA)    Difficulty of Paying Living Expenses: Not hard at all  Food Insecurity: No Food Insecurity (02/27/2023)   Received from [facility]   Hunger Vital Sign    Worried About Running Out of Food in the Last Year: Never true    Ran Out of Food in the Last Year: Never true  Transportation Needs: No Transportation Needs (02/27/2023)   Received from [facility] - Transportation    Lack of Transportation (Medical): No    Lack of Transportation (Non-Medical): No  Physical Activity: Insufficiently Active (12/11/2022)   Received from [facility]   Exercise Vital Sign    Days of Exercise per Week: 4 days    Minutes of Exercise per  Session: 30 min  Stress: Stress Concern Present (12/11/2022)   Received from [facility] - Occupational Stress Questionnaire    Feeling of Stress : To some extent  Social Connections: Socially Integrated (12/11/2022)   Received from [facility]   Social Network    How would you rate your social network (family, work, friends)?: Good participation with social networks  Intimate Partner Violence: Not At Risk (12/11/2022)   Received from [facility]   HITS    Over the last 12 months how often did your partner physically hurt you?: Never    Over the last 12 months how often did your partner insult you or talk down to you?: Never    Over the last 12 months how often did your partner  threaten you with physical harm?: Never    Over the last 12 months how often did your partner scream or curse at you?: Never    Review of Systems  Psychiatric/Behavioral:  Positive for sleep disturbance.     Vitals:   03/29/23 1613  BP: 136/89  Pulse: 76  SpO2: 97%     Physical Exam Constitutional:      Appearance: Normal appearance.  HENT:     Head: Normocephalic.     Mouth/Throat:     Mouth: Mucous membranes are moist.  Eyes:     General: No scleral icterus. Cardiovascular:     Rate and Rhythm: Normal rate and regular rhythm.     Heart sounds: No murmur heard.    No friction rub.  Pulmonary:     Effort: No respiratory distress.     Breath sounds: No stridor. No wheezing or rhonchi.  Musculoskeletal:     Cervical back: No rigidity or tenderness.  Neurological:     Mental Status: She is alert.  Psychiatric:        Mood and Affect: Mood normal.     Data Reviewed:  Sleep study reviewed-study 06/14/2022-[facility] 1.5 events an hour  CT scan of the chest 10/18/2022-reviewed by myself, no significant abnormality   Assessment:  Nonrestrictive sleep  Fatigue  Hypertension -On amlodipine  Previous sleep study was negative for significant sleep disordered breathing Has not gained significant weight, no other significant recent health issues  Plan/Recommendations: Follow-up as needed  Continue using oral device for snoring  Unlikely that she has developed significant sleep disordered breathing since her last study as she has not had any significant health issues, being treated for hypertension  Repeating a sleep study can be considered if she continues to have significant symptoms however likelihood of a positive study is low  Encouraged to call with significant concerns  Virl Diamond MD [facility] 03/29/2023, 6:50 PM  CC: Porfirio Oar, PA

## 2023-11-15 NOTE — Progress Notes (Signed)
 " Cardiology Office Note:  .   Date:  11/16/2023  ID:  Stacy Rivera, DOB November 14, 1980, MRN [id number] PCP: Juliane Che, PA  [facility] Providers Cardiologist:  None     History of Present Illness: .    Stacy Rivera is a 43 y.o. female with no signfiicant past medical history who presents for evaluation of chest pain.  She saw her PCP, Reyes Che, PA-C on 10/31 and was struggling with a URI and bilateral ear infections.  She also reported anterior chest wall pain.  She was referred to cardiology for further evaluation.  Discussed the use of AI scribe software for clinical note transcription with the patient, who gave verbal consent to proceed.  History of Present Illness Ms. Stacy Rivera has been experiencing chest pain, which prompted this visit. Her hypertension was first identified last summer with a blood pressure of 150/100 mmHg at an urgent care visit. A follow-up showed a reading of 160/120 mmHg, leading to further evaluation. A D-dimer test was positive, and a CT scan revealed fatty liver but no other significant findings. She is currently on combination therapy for hypertension, which has improved her readings to 120-130/80-90 mmHg.  She reports occasional minor palpitations, described as a sensation of her heart beating quickly, but these are infrequent. She underwent an echo stress test and wore a two-week monitor, with results presumably in her medical chart.  In addition to her cardiovascular concerns, she is managing symptoms of perimenopause, including hot flashes and insomnia, which have been present for the past year to year and a half. 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  Her family history includes hypertension in her sister and father, preeclampsia in her sister, and heart disease in both maternal and paternal grandfathers.  She has a family history of heart disease on both sides, though specific details are unclear due to family residing in [place].  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 She has two children, aged seven and ten, and leads a physically active lifestyle, swimming and lifting weights multiple times a week. She does not smoke and consumes minimal alcohol, about twice a year. She consumes one coffee per day and has been conscious about her salt intake over the past year.  ROS:  As per HPI  Studies Reviewed: SABRA   EKG Interpretation Date/Time:  Friday November 16 2023 08:52:34 EST Ventricular Rate:  69 PR Interval:  156 QRS Duration:  88 QT Interval:  418 QTC Calculation: 447 R Axis:   31  Text Interpretation: Normal sinus rhythm Normal ECG When compared with ECG of 17-Oct-2022 23:16, No significant change was found Confirmed by Raford Riggs ([id number]) on 11/16/2023 8:57:43 AM     Risk Assessment/Calculations:            Physical Exam:   VS:  BP 128/76 (BP Location: Right Arm, Patient Position: Sitting, Cuff Size: Normal)   Pulse 69   Ht 5' 6 (1.676 m)   Wt 176 lb 6.4 oz (80 kg)   SpO2 98%   BMI 28.47 kg/m  , BMI Body mass index is 28.47 kg/m. GENERAL:  Well appearing HEENT: Pupils equal round and reactive, fundi not visualized, oral mucosa unremarkable NECK:  No jugular venous distention, waveform within normal limits, carotid upstroke brisk  and symmetric, no bruits, no thyromegaly LUNGS:  Clear to auscultation bilaterally HEART:  RRR.  PMI not displaced or sustained,S1 and S2 within normal limits, no S3, no S4, no clicks, no rubs, no murmurs ABD:  Flat, positive bowel sounds normal in frequency in pitch, no bruits, no rebound, no guarding, no midline pulsatile mass, no hepatomegaly, no splenomegaly EXT:  2 plus pulses throughout, no edema, no cyanosis no clubbing SKIN:  No rashes no nodules NEURO:   Cranial nerves II through XII grossly intact, motor grossly intact throughout PSYCH:  Cognitively intact, oriented to person place and time   ASSESSMENT AND PLAN: .    Assessment & Plan # Primary hypertension Hypertension with family history. Blood pressure improved with current regimen. No secondary causes evident, further evaluation needed. - Continue current antihypertensive regimen.  Amlodipine 10mg  daily.  - Order renal artery ultrasound. - Order aldosterone and renin levels. - Encourage weight loss. - Discuss potential referral to Healthy Weight and Wellness program.  # Hepatic steatosis (fatty liver) Fatty liver on imaging, no acute symptoms. - Discussed benefits of weight loss and dietary modifications.  # Prediabetes A1c in prediabetes range, no acute symptoms. - Encourage dietary modifications and weight loss. - Discuss potential referral to a nutritionist.  # Hypertriglyceridemia Elevated triglycerides on non-fasting lipid panel, no acute symptoms. - Order fasting lipid panel. - Encourage dietary modifications. - Continue atorvastatin.   # Overweight BMI 28, challenges with weight loss despite efforts. Discussed benefits of weight loss on health. - Encourage continued dietary modifications and exercise. - Discuss potential referral to Healthy Weight and Wellness program.    Dispo: f/u 4 months  Signed, Annabella Scarce, MD   "

## 2023-11-16 ENCOUNTER — Ambulatory Visit (HOSPITAL_BASED_OUTPATIENT_CLINIC_OR_DEPARTMENT_OTHER): Admitting: Cardiovascular Disease

## 2023-11-16 ENCOUNTER — Encounter (HOSPITAL_BASED_OUTPATIENT_CLINIC_OR_DEPARTMENT_OTHER): Payer: Self-pay | Admitting: Cardiovascular Disease

## 2023-11-16 VITALS — BP 128/76 | HR 69 | Ht 66.0 in | Wt 176.4 lb

## 2023-11-16 DIAGNOSIS — Z6828 Body mass index (BMI) 28.0-28.9, adult: Secondary | ICD-10-CM | POA: Diagnosis not present

## 2023-11-16 DIAGNOSIS — I10 Essential (primary) hypertension: Secondary | ICD-10-CM

## 2023-11-16 DIAGNOSIS — R011 Cardiac murmur, unspecified: Secondary | ICD-10-CM

## 2023-11-16 NOTE — Patient Instructions (Signed)
 Medication Instructions:  Your physician recommends that you continue on your current medications as directed. Please refer to the Current Medication list given to you today.   *If you need a refill on your cardiac medications before your next appointment, please call your pharmacy*  Lab Work: RENIN/ALDOSTERONE WHEN YOU HAVE LABS AT YOUR PRIMARY CARE  If you have labs (blood work) drawn today and your tests are completely normal, you will receive your results only by: MyChart Message (if you have MyChart) OR A paper copy in the mail If you have any lab test that is abnormal or we need to change your treatment, we will call you to review the results.  Testing/Procedures: Your physician has requested that you have a renal artery duplex. During this test, an ultrasound is used to evaluate blood flow to the kidneys. Allow one hour for this exam. Do not eat after midnight the day before and avoid carbonated beverages. Take your medications as you usually do.  Follow-Up: At [facility], you and your health needs are our priority.  As part of our continuing mission to provide you with exceptional heart care, our providers are all part of one team.  This team includes your primary Cardiologist (physician) and Advanced Practice Providers or APPs (Physician Assistants and Nurse Practitioners) who all work together to provide you with the care you need, when you need it.  Your next appointment:   4 month(s)  Provider:   Annabella Scarce, MD, Rosaline Bane, NP, or Reche Finder, NP     We recommend signing up for the patient portal called MyChart.  Sign up information is provided on this After Visit Summary.  MyChart is used to connect with patients for Virtual Visits (Telemedicine).  Patients are able to view lab/test results, encounter notes, upcoming appointments, etc.  Non-urgent messages can be sent to your provider as well.   To learn more about what you can do with MyChart, go to  [email or web address].

## 2023-11-23 ENCOUNTER — Other Ambulatory Visit (HOSPITAL_BASED_OUTPATIENT_CLINIC_OR_DEPARTMENT_OTHER): Payer: Self-pay | Admitting: Cardiovascular Disease

## 2023-11-23 ENCOUNTER — Ambulatory Visit (HOSPITAL_COMMUNITY)
Admission: RE | Admit: 2023-11-23 | Discharge: 2023-11-23 | Disposition: A | Discharge status: Home / Self Care | Source: Ambulatory Visit | Attending: Cardiovascular Disease | Admitting: Cardiovascular Disease

## 2023-11-23 DIAGNOSIS — I10 Essential (primary) hypertension: Secondary | ICD-10-CM | POA: Diagnosis present

## 2023-11-27 ENCOUNTER — Ambulatory Visit (HOSPITAL_BASED_OUTPATIENT_CLINIC_OR_DEPARTMENT_OTHER): Payer: Self-pay | Admitting: Family

## 2023-11-27 DIAGNOSIS — I701 Atherosclerosis of renal artery: Secondary | ICD-10-CM

## 2023-11-29 LAB — ALDOSTERONE + RENIN ACTIVITY W/ RATIO
Aldos/Renin Ratio: 0.3 (ref 0.0–30.0)
Aldosterone: 2.4 ng/dL (ref 0.0–30.0)
Renin Activity, Plasma: 7.317 ng/mL/h — ABNORMAL HIGH (ref 0.167–5.380)

## 2023-12-08 ENCOUNTER — Ambulatory Visit: Payer: Self-pay | Admitting: Cardiovascular Disease

## 2024-01-06 ENCOUNTER — Encounter (HOSPITAL_BASED_OUTPATIENT_CLINIC_OR_DEPARTMENT_OTHER): Payer: Self-pay | Admitting: Cardiovascular Disease

## 2024-03-24 ENCOUNTER — Ambulatory Visit (HOSPITAL_BASED_OUTPATIENT_CLINIC_OR_DEPARTMENT_OTHER): Admitting: Cardiovascular Disease
